# Patient Record
Sex: Female | Born: 1950
Health system: Southern US, Community
[De-identification: ages and names within clinical notes are randomized; demographics above are authoritative.]

## PROBLEM LIST (undated history)

## (undated) DIAGNOSIS — E119 Type 2 diabetes mellitus without complications: Secondary | ICD-10-CM

## (undated) DIAGNOSIS — F32A Depression, unspecified: Secondary | ICD-10-CM

## (undated) DIAGNOSIS — M1990 Unspecified osteoarthritis, unspecified site: Secondary | ICD-10-CM

## (undated) DIAGNOSIS — E7800 Pure hypercholesterolemia, unspecified: Secondary | ICD-10-CM

## (undated) DIAGNOSIS — K219 Gastro-esophageal reflux disease without esophagitis: Secondary | ICD-10-CM

## (undated) DIAGNOSIS — F329 Major depressive disorder, single episode, unspecified: Secondary | ICD-10-CM

## (undated) DIAGNOSIS — G4730 Sleep apnea, unspecified: Secondary | ICD-10-CM

## (undated) HISTORY — PX: BREAST BIOPSY: SHX20

## (undated) HISTORY — DX: Depression, unspecified: F32.A

## (undated) HISTORY — DX: Gastro-esophageal reflux disease without esophagitis: K21.9

## (undated) HISTORY — PX: TUBAL LIGATION: SHX77

## (undated) HISTORY — PX: HYSTERECTOMY ABDOMINAL WITH SALPINGECTOMY: SHX6725

## (undated) HISTORY — DX: Pure hypercholesterolemia, unspecified: E78.00

## (undated) HISTORY — DX: Type 2 diabetes mellitus without complications: E11.9

## (undated) HISTORY — DX: Unspecified osteoarthritis, unspecified site: M19.90

## (undated) HISTORY — PX: NASAL SEPTUM SURGERY: SHX37

## (undated) HISTORY — PX: KNEE SURGERY: SHX244

## (undated) HISTORY — DX: Major depressive disorder, single episode, unspecified: F32.9

---

## 2015-11-03 DIAGNOSIS — E559 Vitamin D deficiency, unspecified: Secondary | ICD-10-CM | POA: Diagnosis not present

## 2015-11-03 DIAGNOSIS — E785 Hyperlipidemia, unspecified: Secondary | ICD-10-CM | POA: Diagnosis not present

## 2015-11-03 DIAGNOSIS — E1165 Type 2 diabetes mellitus with hyperglycemia: Secondary | ICD-10-CM | POA: Diagnosis not present

## 2015-11-03 DIAGNOSIS — Z1159 Encounter for screening for other viral diseases: Secondary | ICD-10-CM | POA: Diagnosis not present

## 2015-11-04 DIAGNOSIS — E559 Vitamin D deficiency, unspecified: Secondary | ICD-10-CM | POA: Diagnosis not present

## 2015-11-04 DIAGNOSIS — Z1159 Encounter for screening for other viral diseases: Secondary | ICD-10-CM | POA: Diagnosis not present

## 2015-11-04 DIAGNOSIS — E785 Hyperlipidemia, unspecified: Secondary | ICD-10-CM | POA: Diagnosis not present

## 2015-11-04 DIAGNOSIS — E1165 Type 2 diabetes mellitus with hyperglycemia: Secondary | ICD-10-CM | POA: Diagnosis not present

## 2016-03-03 DIAGNOSIS — E119 Type 2 diabetes mellitus without complications: Secondary | ICD-10-CM | POA: Diagnosis not present

## 2016-03-03 DIAGNOSIS — F330 Major depressive disorder, recurrent, mild: Secondary | ICD-10-CM | POA: Diagnosis not present

## 2016-03-03 DIAGNOSIS — Z6836 Body mass index (BMI) 36.0-36.9, adult: Secondary | ICD-10-CM | POA: Diagnosis not present

## 2016-03-19 DIAGNOSIS — R7301 Impaired fasting glucose: Secondary | ICD-10-CM | POA: Diagnosis not present

## 2016-03-19 DIAGNOSIS — I10 Essential (primary) hypertension: Secondary | ICD-10-CM | POA: Diagnosis not present

## 2016-03-19 DIAGNOSIS — I482 Chronic atrial fibrillation: Secondary | ICD-10-CM | POA: Diagnosis not present

## 2016-03-19 DIAGNOSIS — E039 Hypothyroidism, unspecified: Secondary | ICD-10-CM | POA: Diagnosis not present

## 2016-03-19 DIAGNOSIS — E119 Type 2 diabetes mellitus without complications: Secondary | ICD-10-CM | POA: Diagnosis not present

## 2016-03-19 DIAGNOSIS — E785 Hyperlipidemia, unspecified: Secondary | ICD-10-CM | POA: Diagnosis not present

## 2016-03-19 DIAGNOSIS — R160 Hepatomegaly, not elsewhere classified: Secondary | ICD-10-CM | POA: Diagnosis not present

## 2016-03-19 DIAGNOSIS — E782 Mixed hyperlipidemia: Secondary | ICD-10-CM | POA: Diagnosis not present

## 2016-03-22 DIAGNOSIS — E782 Mixed hyperlipidemia: Secondary | ICD-10-CM | POA: Diagnosis not present

## 2016-03-22 DIAGNOSIS — Z6836 Body mass index (BMI) 36.0-36.9, adult: Secondary | ICD-10-CM | POA: Diagnosis not present

## 2016-03-22 DIAGNOSIS — Z0000 Encounter for general adult medical examination without abnormal findings: Secondary | ICD-10-CM | POA: Diagnosis not present

## 2016-03-22 DIAGNOSIS — E119 Type 2 diabetes mellitus without complications: Secondary | ICD-10-CM | POA: Diagnosis not present

## 2016-03-22 DIAGNOSIS — F330 Major depressive disorder, recurrent, mild: Secondary | ICD-10-CM | POA: Diagnosis not present

## 2016-03-22 DIAGNOSIS — R945 Abnormal results of liver function studies: Secondary | ICD-10-CM | POA: Diagnosis not present

## 2016-03-23 ENCOUNTER — Other Ambulatory Visit (HOSPITAL_COMMUNITY): Payer: Self-pay | Admitting: Internal Medicine

## 2016-03-23 DIAGNOSIS — E119 Type 2 diabetes mellitus without complications: Secondary | ICD-10-CM | POA: Diagnosis not present

## 2016-03-23 DIAGNOSIS — H2513 Age-related nuclear cataract, bilateral: Secondary | ICD-10-CM | POA: Diagnosis not present

## 2016-03-23 DIAGNOSIS — R160 Hepatomegaly, not elsewhere classified: Secondary | ICD-10-CM

## 2016-03-30 ENCOUNTER — Ambulatory Visit (HOSPITAL_COMMUNITY)
Admission: RE | Admit: 2016-03-30 | Discharge: 2016-03-30 | Disposition: A | Discharge status: Home / Self Care | Payer: Medicare Other | Source: Ambulatory Visit | Attending: Internal Medicine | Admitting: Internal Medicine

## 2016-03-30 DIAGNOSIS — K760 Fatty (change of) liver, not elsewhere classified: Secondary | ICD-10-CM | POA: Insufficient documentation

## 2016-03-30 DIAGNOSIS — R160 Hepatomegaly, not elsewhere classified: Secondary | ICD-10-CM | POA: Diagnosis not present

## 2016-04-28 DIAGNOSIS — F330 Major depressive disorder, recurrent, mild: Secondary | ICD-10-CM | POA: Diagnosis not present

## 2016-04-28 DIAGNOSIS — E782 Mixed hyperlipidemia: Secondary | ICD-10-CM | POA: Diagnosis not present

## 2016-04-28 DIAGNOSIS — E119 Type 2 diabetes mellitus without complications: Secondary | ICD-10-CM | POA: Diagnosis not present

## 2016-04-28 DIAGNOSIS — Z23 Encounter for immunization: Secondary | ICD-10-CM | POA: Diagnosis not present

## 2016-04-28 DIAGNOSIS — Z6837 Body mass index (BMI) 37.0-37.9, adult: Secondary | ICD-10-CM | POA: Diagnosis not present

## 2016-06-09 DIAGNOSIS — Z6837 Body mass index (BMI) 37.0-37.9, adult: Secondary | ICD-10-CM | POA: Diagnosis not present

## 2016-06-09 DIAGNOSIS — F330 Major depressive disorder, recurrent, mild: Secondary | ICD-10-CM | POA: Diagnosis not present

## 2016-06-09 DIAGNOSIS — E782 Mixed hyperlipidemia: Secondary | ICD-10-CM | POA: Diagnosis not present

## 2016-06-09 DIAGNOSIS — E119 Type 2 diabetes mellitus without complications: Secondary | ICD-10-CM | POA: Diagnosis not present

## 2016-07-12 DIAGNOSIS — E119 Type 2 diabetes mellitus without complications: Secondary | ICD-10-CM | POA: Diagnosis not present

## 2016-07-12 DIAGNOSIS — E782 Mixed hyperlipidemia: Secondary | ICD-10-CM | POA: Diagnosis not present

## 2016-07-14 DIAGNOSIS — F330 Major depressive disorder, recurrent, mild: Secondary | ICD-10-CM | POA: Diagnosis not present

## 2016-07-14 DIAGNOSIS — E782 Mixed hyperlipidemia: Secondary | ICD-10-CM | POA: Diagnosis not present

## 2016-07-14 DIAGNOSIS — E1165 Type 2 diabetes mellitus with hyperglycemia: Secondary | ICD-10-CM | POA: Diagnosis not present

## 2016-07-14 DIAGNOSIS — Z6838 Body mass index (BMI) 38.0-38.9, adult: Secondary | ICD-10-CM | POA: Diagnosis not present

## 2016-08-04 DIAGNOSIS — Z6838 Body mass index (BMI) 38.0-38.9, adult: Secondary | ICD-10-CM | POA: Diagnosis not present

## 2016-08-04 DIAGNOSIS — E1165 Type 2 diabetes mellitus with hyperglycemia: Secondary | ICD-10-CM | POA: Diagnosis not present

## 2016-08-04 DIAGNOSIS — F330 Major depressive disorder, recurrent, mild: Secondary | ICD-10-CM | POA: Diagnosis not present

## 2016-08-04 DIAGNOSIS — E782 Mixed hyperlipidemia: Secondary | ICD-10-CM | POA: Diagnosis not present

## 2016-08-12 DIAGNOSIS — F330 Major depressive disorder, recurrent, mild: Secondary | ICD-10-CM | POA: Diagnosis not present

## 2016-08-12 DIAGNOSIS — R4182 Altered mental status, unspecified: Secondary | ICD-10-CM | POA: Diagnosis not present

## 2016-08-12 DIAGNOSIS — Z6838 Body mass index (BMI) 38.0-38.9, adult: Secondary | ICD-10-CM | POA: Diagnosis not present

## 2016-08-18 ENCOUNTER — Other Ambulatory Visit (HOSPITAL_COMMUNITY): Payer: Self-pay | Admitting: Internal Medicine

## 2016-08-18 DIAGNOSIS — R4182 Altered mental status, unspecified: Secondary | ICD-10-CM

## 2016-08-25 ENCOUNTER — Ambulatory Visit (HOSPITAL_COMMUNITY)
Admission: RE | Admit: 2016-08-25 | Discharge: 2016-08-25 | Disposition: A | Discharge status: Home / Self Care | Payer: Medicare Other | Source: Ambulatory Visit | Attending: Internal Medicine | Admitting: Internal Medicine

## 2016-08-25 DIAGNOSIS — R410 Disorientation, unspecified: Secondary | ICD-10-CM | POA: Diagnosis not present

## 2016-08-25 DIAGNOSIS — R4182 Altered mental status, unspecified: Secondary | ICD-10-CM | POA: Insufficient documentation

## 2016-08-25 DIAGNOSIS — E041 Nontoxic single thyroid nodule: Secondary | ICD-10-CM | POA: Insufficient documentation

## 2016-09-24 ENCOUNTER — Other Ambulatory Visit (HOSPITAL_COMMUNITY): Payer: Self-pay | Admitting: Internal Medicine

## 2016-09-24 DIAGNOSIS — E041 Nontoxic single thyroid nodule: Secondary | ICD-10-CM

## 2016-09-28 ENCOUNTER — Ambulatory Visit (HOSPITAL_COMMUNITY)
Admission: RE | Admit: 2016-09-28 | Discharge: 2016-09-28 | Disposition: A | Discharge status: Home / Self Care | Payer: Medicare Other | Source: Ambulatory Visit | Attending: Internal Medicine | Admitting: Internal Medicine

## 2016-09-28 DIAGNOSIS — E042 Nontoxic multinodular goiter: Secondary | ICD-10-CM | POA: Diagnosis not present

## 2016-09-28 DIAGNOSIS — E041 Nontoxic single thyroid nodule: Secondary | ICD-10-CM

## 2016-11-05 DIAGNOSIS — M25512 Pain in left shoulder: Secondary | ICD-10-CM | POA: Diagnosis not present

## 2016-11-05 DIAGNOSIS — J019 Acute sinusitis, unspecified: Secondary | ICD-10-CM | POA: Diagnosis not present

## 2016-11-05 DIAGNOSIS — Z6838 Body mass index (BMI) 38.0-38.9, adult: Secondary | ICD-10-CM | POA: Diagnosis not present

## 2016-11-15 DIAGNOSIS — E1165 Type 2 diabetes mellitus with hyperglycemia: Secondary | ICD-10-CM | POA: Diagnosis not present

## 2016-11-15 DIAGNOSIS — E782 Mixed hyperlipidemia: Secondary | ICD-10-CM | POA: Diagnosis not present

## 2016-11-17 ENCOUNTER — Encounter: Payer: Self-pay | Admitting: Orthopedic Surgery

## 2016-11-17 ENCOUNTER — Ambulatory Visit (INDEPENDENT_AMBULATORY_CARE_PROVIDER_SITE_OTHER): Payer: Medicare Other | Admitting: Orthopedic Surgery

## 2016-11-17 ENCOUNTER — Ambulatory Visit (INDEPENDENT_AMBULATORY_CARE_PROVIDER_SITE_OTHER): Payer: Medicare Other

## 2016-11-17 VITALS — BP 132/81 | HR 85 | Ht 64.0 in | Wt 227.0 lb

## 2016-11-17 DIAGNOSIS — M7551 Bursitis of right shoulder: Secondary | ICD-10-CM

## 2016-11-17 DIAGNOSIS — M47812 Spondylosis without myelopathy or radiculopathy, cervical region: Secondary | ICD-10-CM

## 2016-11-17 DIAGNOSIS — M542 Cervicalgia: Secondary | ICD-10-CM

## 2016-11-17 DIAGNOSIS — M25511 Pain in right shoulder: Secondary | ICD-10-CM | POA: Diagnosis not present

## 2016-11-17 NOTE — Progress Notes (Addendum)
NEW PATIENT OFFICE VISIT    Chief Complaint  Patient presents with  . New Patient (Initial Visit)    Right Shoulder pain from fall 09/2016    New patient evaluation. 66 years old fell off a ladder first week of August comes in complaining of right shoulder and arm pain. She describes a dull ache in the right upper arm near the deltoid which is constant and associated with intermittent pain with internal rotation some loss of motion with internal rotation normal forward elevation with pain.  She also describes what she refers to as a tic. She says she often has to turn or bend her neck away from her right side because of pain she says it's spasmodic she's had that for a year and when she puts her arm on the steering well and drives for long time fingers go numb    Review of Systems  Constitutional: Negative for fever.  Respiratory: Negative.   Cardiovascular: Negative.   Musculoskeletal: Positive for neck pain.  Neurological: Positive for tingling and sensory change. Negative for speech change and seizures.     Past Medical History:  Diagnosis Date  . Depression   . Diabetes mellitus without complication (HCC)   . GERD (gastroesophageal reflux disease)   . Osteoarthritis       History reviewed. No pertinent family history. Social History  Substance Use Topics  . Smoking status: Not on file  . Smokeless tobacco: Not on file  . Alcohol use Not on file    BP 132/81   Pulse 85   Ht  (1.626 m)   Wt 227 lb (103 kg)   BMI 38.96 kg/m   Physical Exam  Constitutional: She is oriented to person, place, and time and well-developed, well-nourished, and in no distress. No distress.  Neck: Normal range of motion and full passive range of motion without pain. Neck supple. Spinous process tenderness and muscular tenderness present.  Cardiovascular: Intact distal pulses.   Neurological: She is alert and oriented to person, place, and time. She displays normal reflexes. She  exhibits normal muscle tone. Gait normal. Coordination normal.  Skin: She is not diaphoretic.  Psychiatric: Mood, memory, affect and judgment normal.     Physical Exam  Constitutional: She is oriented to person, place, and time and well-developed, well-nourished, and in no distress. No distress.  Neck: Normal range of motion and full passive range of motion without pain. Neck supple. Spinous process tenderness and muscular tenderness present.  Cardiovascular: Intact distal pulses.   Neurological: She is alert and oriented to person, place, and time. She displays normal reflexes. She exhibits normal muscle tone. Gait normal. Coordination normal.  Skin: She is not diaphoretic.  Psychiatric: Mood, memory, affect and judgment normal.    Left Shoulder Exam   Tenderness  None  Range of Motion  Normal left shoulder ROM  Muscle Strength  Normal left shoulder strength  Tests  Impingement:   Negative Drop Arm:        Negative Apprehension: Negative Sulcus:            Negative  Right Shoulder Exam   Tenderness  The patient is experiencing tenderness in the upper arm and deltoid .  Range of Motion  Active Abduction:                       Normal Passive Abduction:  Normal Extension:                                  Normal Forward Flexion:                        180 External Rotation:                      50 Internal Rotation 0 degrees:      Mid Thoracic  Muscle Strength  Abduction:            5/5 Internal Rotation:  5/5 External Rotation: 5/5 Supraspinatus:     5/5 Subscapularis:     5/5 Biceps:                 5/5    X-rays today read by me see report  Chronic bone changes of the greater tuberosity and undersurface of acromion show evidence of chronic rotator cuff disease without acute injury  Cervical spine films show moderate spondylosis C3-C5   Encounter Diagnoses  Name Primary?  . Cervical spondylosis without myelopathy Yes  . Subacromial  bursitis of right shoulder joint     PLAN:   #1 subacromial injection  Procedure note the subacromial injection shoulder RIGHT  Verbal consent was obtained to inject the  RIGHT   Shoulder  Timeout was completed to confirm the injection site is a subacromial space of the  RIGHT  shoulder   Medication used Depo-Medrol 40 mg and lidocaine 1% 3 cc  Anesthesia was provided by ethyl chloride  The injection was performed in the RIGHT  posterior subacromial space. After pinning the skin with alcohol and anesthetized the skin with ethyl chloride the subacromial space was injected using a 20-gauge needle. There were no complications  Sterile dressing was applied.   #2 physical therapy at hand in rehabilitation neck and shoulder  Follow up as needed

## 2016-11-17 NOTE — Patient Instructions (Addendum)
We have scheduled you for therapy on the neck and shoulder, if you do not improve after 6 weeks please schedule a new appointment    Bursitis Bursitis is inflammation and irritation of a bursa, which is one of the small, fluid-filled sacs that cushion and protect the moving parts of your body. These sacs are located between bones and muscles, muscle attachments, or skin areas next to bones. A bursa protects these structures from the wear and tear that results from frequent movement. An inflamed bursa causes pain and swelling. Fluid may build up inside the sac. Bursitis is most common near joints, especially the knees, elbows, hips, and shoulders. What are the causes? Bursitis can be caused by:  Injury from: ? A direct blow, like falling on your knee or elbow. ? Overuse of a joint (repetitive stress).  Infection. This can happen if bacteria gets into a bursa through a cut or scrape near a joint.  Diseases that cause joint inflammation, such as gout and rheumatoid arthritis.  What increases the risk? You may be at risk for bursitis if you:  Have a job or hobby that involves a lot of repetitive stress on your joints.  Have a condition that weakens your body's defense system (immune system), such as diabetes, cancer, or HIV.  Lift and reach overhead often.  Kneel or lean on hard surfaces often.  Run or walk often.  What are the signs or symptoms? The most common signs and symptoms of bursitis are:  Pain that gets worse when you move the affected body part or put weight on it.  Inflammation.  Stiffness.  Other signs and symptoms may include:  Redness.  Tenderness.  Warmth.  Pain that continues after rest.  Fever and chills. This may occur in bursitis caused by infection.  How is this diagnosed? Bursitis may be diagnosed by:  Medical history and physical exam.  MRI.  A procedure to drain fluid from the bursa with a needle (aspiration). The fluid may be checked for  signs of infection or gout.  Blood tests to rule out other causes of inflammation.  How is this treated? Bursitis can usually be treated at home with rest, ice, compression, and elevation (RICE). For mild bursitis, RICE treatment may be all you need. Other treatments may include:  Nonsteroidal anti-inflammatory drugs (NSAIDs) to treat pain and inflammation.  Corticosteroids to fight inflammation. You may have these drugs injected into and around the area of bursitis.  Aspiration of bursitis fluid to relieve pain and improve movement.  Antibiotic medicine to treat an infected bursa.  A splint, brace, or walking aid.  Physical therapy if you continue to have pain or limited movement.  Surgery to remove a damaged or infected bursa. This may be needed if you have a very bad case of bursitis or if other treatments have not worked.  Follow these instructions at home:  Take medicines only as directed by your health care provider.  If you were prescribed an antibiotic medicine, finish it all even if you start to feel better.  Rest the affected area as directed by your health care provider. ? Keep the area elevated. ? Avoid activities that make pain worse.  Apply ice to the injured area: ? Place ice in a plastic bag. ? Place a towel between your skin and the bag. ? Leave the ice on for 20 minutes, 2-3 times a day.  Use splints, braces, pads, or walking aids as directed by your health care provider.  Keep all follow-up visits as directed by your health care provider. This is important. How is this prevented?  Wear knee pads if you kneel often.  Wear sturdy running or walking shoes that fit you well.  Take regular breaks from repetitive activity.  Warm up by stretching before doing any strenuous activity.  Maintain a healthy weight or lose weight as recommended by your health care provider. Ask your health care provider if you need help.  Exercise regularly. Start any new  physical activity gradually. Contact a health care provider if:  Your bursitis is not responding to treatment or home care.  You have a fever.  You have chills. This information is not intended to replace advice given to you by your health care provider. Make sure you discuss any questions you have with your health care provider. Document Released: 01/23/2000 Document Revised: 07/03/2015 Document Reviewed: 04/16/2013 Elsevier Interactive Patient Education  2018 [employer].  Cervical Radiculopathy Cervical radiculopathy means that a nerve in the neck is pinched or bruised. This can cause pain or loss of feeling (numbness) that runs from your neck to your arm and fingers. Follow these instructions at home: Managing pain  Take over-the-counter and prescription medicines only as told by your doctor.  If directed, put ice on the injured or painful area. ? Put ice in a plastic bag. ? Place a towel between your skin and the bag. ? Leave the ice on for 20 minutes, 2-3 times per day.  If ice does not help, you can try using heat. Take a warm shower or warm bath, or use a heat pack as told by your doctor.  You may try a gentle neck and shoulder massage. Activity  Rest as needed. Follow instructions from your doctor about any activities to avoid.  Do exercises as told by your doctor or physical therapist. General instructions  If you were given a soft collar, wear it as told by your doctor.  Use a flat pillow when you sleep.  Keep all follow-up visits as told by your doctor. This is important. Contact a doctor if:  Your condition does not improve with treatment. Get help right away if:  Your pain gets worse and is not controlled with medicine.  You lose feeling or feel weak in your hand, arm, face, or leg.  You have a fever.  You have a stiff neck.  You cannot control when you poop or pee (have incontinence).  You have trouble with walking, balance, or talking. This  information is not intended to replace advice given to you by your health care provider. Make sure you discuss any questions you have with your health care provider. Document Released: 01/14/2011 Document Revised: 07/03/2015 Document Reviewed: 03/21/2014 Elsevier Interactive Patient Education  [employer].

## 2016-11-19 DIAGNOSIS — E1165 Type 2 diabetes mellitus with hyperglycemia: Secondary | ICD-10-CM | POA: Diagnosis not present

## 2016-11-19 DIAGNOSIS — E782 Mixed hyperlipidemia: Secondary | ICD-10-CM | POA: Diagnosis not present

## 2016-11-19 DIAGNOSIS — R1310 Dysphagia, unspecified: Secondary | ICD-10-CM | POA: Diagnosis not present

## 2016-11-19 DIAGNOSIS — F330 Major depressive disorder, recurrent, mild: Secondary | ICD-10-CM | POA: Diagnosis not present

## 2016-11-19 DIAGNOSIS — B379 Candidiasis, unspecified: Secondary | ICD-10-CM | POA: Diagnosis not present

## 2016-11-26 DIAGNOSIS — M545 Low back pain: Secondary | ICD-10-CM | POA: Diagnosis not present

## 2016-11-26 DIAGNOSIS — M2560 Stiffness of unspecified joint, not elsewhere classified: Secondary | ICD-10-CM | POA: Diagnosis not present

## 2016-11-26 DIAGNOSIS — M542 Cervicalgia: Secondary | ICD-10-CM | POA: Diagnosis not present

## 2016-11-26 DIAGNOSIS — M25511 Pain in right shoulder: Secondary | ICD-10-CM | POA: Diagnosis not present

## 2016-11-30 DIAGNOSIS — M545 Low back pain: Secondary | ICD-10-CM | POA: Diagnosis not present

## 2016-11-30 DIAGNOSIS — M542 Cervicalgia: Secondary | ICD-10-CM | POA: Diagnosis not present

## 2016-11-30 DIAGNOSIS — M2560 Stiffness of unspecified joint, not elsewhere classified: Secondary | ICD-10-CM | POA: Diagnosis not present

## 2016-11-30 DIAGNOSIS — M25511 Pain in right shoulder: Secondary | ICD-10-CM | POA: Diagnosis not present

## 2016-12-03 ENCOUNTER — Encounter: Payer: Self-pay | Admitting: Internal Medicine

## 2016-12-06 DIAGNOSIS — M545 Low back pain: Secondary | ICD-10-CM | POA: Diagnosis not present

## 2016-12-06 DIAGNOSIS — M25511 Pain in right shoulder: Secondary | ICD-10-CM | POA: Diagnosis not present

## 2016-12-06 DIAGNOSIS — M2560 Stiffness of unspecified joint, not elsewhere classified: Secondary | ICD-10-CM | POA: Diagnosis not present

## 2016-12-06 DIAGNOSIS — M542 Cervicalgia: Secondary | ICD-10-CM | POA: Diagnosis not present

## 2016-12-09 DIAGNOSIS — M25511 Pain in right shoulder: Secondary | ICD-10-CM | POA: Diagnosis not present

## 2016-12-09 DIAGNOSIS — M2560 Stiffness of unspecified joint, not elsewhere classified: Secondary | ICD-10-CM | POA: Diagnosis not present

## 2016-12-09 DIAGNOSIS — M542 Cervicalgia: Secondary | ICD-10-CM | POA: Diagnosis not present

## 2016-12-09 DIAGNOSIS — M545 Low back pain: Secondary | ICD-10-CM | POA: Diagnosis not present

## 2016-12-13 DIAGNOSIS — M542 Cervicalgia: Secondary | ICD-10-CM | POA: Diagnosis not present

## 2016-12-13 DIAGNOSIS — M25511 Pain in right shoulder: Secondary | ICD-10-CM | POA: Diagnosis not present

## 2016-12-13 DIAGNOSIS — M2560 Stiffness of unspecified joint, not elsewhere classified: Secondary | ICD-10-CM | POA: Diagnosis not present

## 2016-12-13 DIAGNOSIS — M545 Low back pain: Secondary | ICD-10-CM | POA: Diagnosis not present

## 2016-12-16 DIAGNOSIS — M542 Cervicalgia: Secondary | ICD-10-CM | POA: Diagnosis not present

## 2016-12-16 DIAGNOSIS — M25511 Pain in right shoulder: Secondary | ICD-10-CM | POA: Diagnosis not present

## 2016-12-16 DIAGNOSIS — M545 Low back pain: Secondary | ICD-10-CM | POA: Diagnosis not present

## 2016-12-16 DIAGNOSIS — M2560 Stiffness of unspecified joint, not elsewhere classified: Secondary | ICD-10-CM | POA: Diagnosis not present

## 2016-12-20 DIAGNOSIS — M545 Low back pain: Secondary | ICD-10-CM | POA: Diagnosis not present

## 2016-12-20 DIAGNOSIS — M25511 Pain in right shoulder: Secondary | ICD-10-CM | POA: Diagnosis not present

## 2016-12-20 DIAGNOSIS — M2560 Stiffness of unspecified joint, not elsewhere classified: Secondary | ICD-10-CM | POA: Diagnosis not present

## 2016-12-20 DIAGNOSIS — M542 Cervicalgia: Secondary | ICD-10-CM | POA: Diagnosis not present

## 2016-12-28 DIAGNOSIS — M2560 Stiffness of unspecified joint, not elsewhere classified: Secondary | ICD-10-CM | POA: Diagnosis not present

## 2016-12-28 DIAGNOSIS — M542 Cervicalgia: Secondary | ICD-10-CM | POA: Diagnosis not present

## 2016-12-28 DIAGNOSIS — M25511 Pain in right shoulder: Secondary | ICD-10-CM | POA: Diagnosis not present

## 2016-12-28 DIAGNOSIS — M545 Low back pain: Secondary | ICD-10-CM | POA: Diagnosis not present

## 2017-01-03 DIAGNOSIS — M545 Low back pain: Secondary | ICD-10-CM | POA: Diagnosis not present

## 2017-01-03 DIAGNOSIS — M542 Cervicalgia: Secondary | ICD-10-CM | POA: Diagnosis not present

## 2017-01-03 DIAGNOSIS — M25511 Pain in right shoulder: Secondary | ICD-10-CM | POA: Diagnosis not present

## 2017-01-03 DIAGNOSIS — M2560 Stiffness of unspecified joint, not elsewhere classified: Secondary | ICD-10-CM | POA: Diagnosis not present

## 2017-01-07 DIAGNOSIS — M545 Low back pain: Secondary | ICD-10-CM | POA: Diagnosis not present

## 2017-01-07 DIAGNOSIS — M25511 Pain in right shoulder: Secondary | ICD-10-CM | POA: Diagnosis not present

## 2017-01-07 DIAGNOSIS — M542 Cervicalgia: Secondary | ICD-10-CM | POA: Diagnosis not present

## 2017-01-07 DIAGNOSIS — M2560 Stiffness of unspecified joint, not elsewhere classified: Secondary | ICD-10-CM | POA: Diagnosis not present

## 2017-01-10 DIAGNOSIS — M545 Low back pain: Secondary | ICD-10-CM | POA: Diagnosis not present

## 2017-01-10 DIAGNOSIS — M25511 Pain in right shoulder: Secondary | ICD-10-CM | POA: Diagnosis not present

## 2017-01-10 DIAGNOSIS — M2560 Stiffness of unspecified joint, not elsewhere classified: Secondary | ICD-10-CM | POA: Diagnosis not present

## 2017-01-10 DIAGNOSIS — M542 Cervicalgia: Secondary | ICD-10-CM | POA: Diagnosis not present

## 2017-01-14 DIAGNOSIS — E1165 Type 2 diabetes mellitus with hyperglycemia: Secondary | ICD-10-CM | POA: Diagnosis not present

## 2017-01-14 DIAGNOSIS — F330 Major depressive disorder, recurrent, mild: Secondary | ICD-10-CM | POA: Diagnosis not present

## 2017-01-14 DIAGNOSIS — Z6835 Body mass index (BMI) 35.0-35.9, adult: Secondary | ICD-10-CM | POA: Diagnosis not present

## 2017-01-24 DIAGNOSIS — M25511 Pain in right shoulder: Secondary | ICD-10-CM | POA: Diagnosis not present

## 2017-01-24 DIAGNOSIS — M2560 Stiffness of unspecified joint, not elsewhere classified: Secondary | ICD-10-CM | POA: Diagnosis not present

## 2017-01-24 DIAGNOSIS — M542 Cervicalgia: Secondary | ICD-10-CM | POA: Diagnosis not present

## 2017-01-24 DIAGNOSIS — M545 Low back pain: Secondary | ICD-10-CM | POA: Diagnosis not present

## 2017-01-26 ENCOUNTER — Ambulatory Visit (INDEPENDENT_AMBULATORY_CARE_PROVIDER_SITE_OTHER): Payer: Medicare Other | Admitting: Gastroenterology

## 2017-01-26 ENCOUNTER — Telehealth: Payer: Self-pay | Admitting: *Deleted

## 2017-01-26 ENCOUNTER — Encounter: Payer: Self-pay | Admitting: Gastroenterology

## 2017-01-26 ENCOUNTER — Other Ambulatory Visit: Payer: Self-pay | Admitting: *Deleted

## 2017-01-26 ENCOUNTER — Encounter: Payer: Self-pay | Admitting: *Deleted

## 2017-01-26 VITALS — BP 139/75 | HR 79 | Temp 97.0°F | Ht 63.0 in | Wt 229.2 lb

## 2017-01-26 DIAGNOSIS — R1319 Other dysphagia: Secondary | ICD-10-CM | POA: Diagnosis not present

## 2017-01-26 DIAGNOSIS — K760 Fatty (change of) liver, not elsewhere classified: Secondary | ICD-10-CM | POA: Diagnosis not present

## 2017-01-26 NOTE — Telephone Encounter (Signed)
Called spoke with pt and is aware pre-op scheduled for 03/04/17 at 11:00am. Letter mailed to pt.

## 2017-01-26 NOTE — Patient Instructions (Signed)
We have scheduled you for an upper endoscopy with dilation by Dr. Jena Gaussourk.  We will decide about reflux medication after the procedure.  We will see you back in February 2019 to see how you are doing!

## 2017-01-26 NOTE — Assessment & Plan Note (Signed)
66 year old female with predominantly oropharyngeal dysphagia for several years, also noting a chronic globus sensation in setting of long-standing intermittent GERD. Currently not on a PPI and takes Tums every few weeks. [place]. She has not had a BPE, but I do note a thyroid ultrasound with bilateral thyroid nodules and cysts, with a complex nodule in left inferior thyroid that is followed by PCP. Discussed endoscopic evaluation with possible need for BPE thereafter.   Proceed with upper endoscopy possible dilation in the near future with Dr. Jena Gaussourk. The risks, benefits, and alternatives have been discussed in detail with patient. They have stated understanding and desire to proceed.  PROPOFOL due to polypharmacy Not currently on a PPI, which we had discussed empirically treating; however, finances are limited. Omeprazole would be best covered, but she is on Celexa 40 mg. Combination of these two could possibly affect QT interval. She is willing to consider other agents even though more costly but would like to wait until review of endoscopic findings. Would recommend Protonix (Tier 2), which would be more affordable.  Feb 2018 follow-up  Declining colonoscopy at this time. Last in 2006. Will discuss at next visit. No concerning lower GI signs/symptoms.

## 2017-01-26 NOTE — Assessment & Plan Note (Signed)
On ultrasound. LFTs normal. Continue following of LFTs, dietary and behavior modification.

## 2017-01-26 NOTE — Progress Notes (Signed)
Primary Care Physician:  Celene Squibb, MD Primary Gastroenterologist:  Dr. Gala Romney   Chief Complaint  Patient presents with  . Dysphagia    x few years.    HPI:   Maria Williamson is a 66 y.o. female presenting today at the request of Celene Squibb, MD secondary to dysphagia. She notes several year history of these symptoms.    Notes that at times she will be turning her head while drinking and choke. Always feels like she has a lump in her throat. Notes choking on liquids. Notes issues with solid foods as well. Feels like it is hitting something in her throat and choking her. Feels like she is being strangled. Predominantly oropharyngeal symptoms. Takes Tums for chronic reflux. No PPI. Her symptoms run in spells. For a few weeks will chew about 4 a day then be fine for a few weeks. Used to buy Nexium OTC but finances are limited. Omeprazole tier 2 on formulary and able to get from [facility] for 24$ 3 month supply. However, she is on Celexa 40 mg, so Prilosec would not be the best option to use. Protonix is Tier 2.   Notes a history of IBS. Used to be under a lot of stress and had to carry wipes and underwear. Was caring for an ailing parent. Much improved now. Every once in awhile will have an urgent BM that is fluffy and mucus but otherwise at baseline. Ranges between constipation and diarrhea. Miralax prn for constipation. Comes out as water if taking Miralax. Doesn't help move her bowels. Colonoscopy in 2006 in [place], reportedly normal. EGD also in 2006.  No rectal bleeding. A lot of nausea but attributes this to her medications. Does not want to pursue routine screening colonoscopy at this time.    Past Medical History:  Diagnosis Date  . Depression   . Diabetes mellitus without complication ([place])   . GERD (gastroesophageal reflux disease)   . Hypercholesterolemia   . Osteoarthritis     Past Surgical History:  Procedure Laterality Date  . HYSTERECTOMY ABDOMINAL WITH SALPINGECTOMY      . KNEE SURGERY    . NASAL SEPTUM SURGERY    . TUBAL LIGATION      Current Outpatient Medications  Medication Sig Dispense Refill  . aspirin 81 MG chewable tablet Chew 81 mg by mouth daily.     . citalopram (CELEXA) 40 MG tablet Take 40 mg by mouth daily.    . fenofibrate 160 MG tablet 160 mg daily.    . furosemide (LASIX) 20 MG tablet Take 10 mg by mouth.     Marland Kitchen glipiZIDE (GLUCOTROL) 10 MG tablet Take 10 mg by mouth 2 (two) times daily before a meal.    . Insulin Glargine (BASAGLAR KWIKPEN [place]) Inject 50 Units into the skin at bedtime.    Marland Kitchen LORazepam (ATIVAN) 1 MG tablet Take 1 mg by mouth 2 (two) times daily.     . meloxicam (MOBIC) 15 MG tablet Take 15 mg by mouth daily.     . metFORMIN (GLUCOPHAGE) 1000 MG tablet 1,000 mg 2 (two) times daily.     No current facility-administered medications for this visit.     Allergies as of 01/26/2017 - Review Complete 01/26/2017  Allergen Reaction Noted  . Excedrin extra strength [asa-apap-caff buffered] Diarrhea and Nausea Only 11/17/2016  . Statins Other (See Comments) 11/17/2016  . Wellbutrin [bupropion] Hives 11/17/2016    Family History  Problem Relation Age of Onset  .  Heart failure Mother   . Heart disease Father   . Cancer Father        Lung  . Multiple sclerosis Daughter   . Diabetes Paternal Aunt   . Heart disease Maternal Grandmother   . Heart disease Maternal Grandfather   . Diabetes Paternal Grandmother   . Heart disease Paternal Grandmother   . Heart disease Paternal Grandfather   . Colon cancer Neg Hx   . Colon polyps Neg Hx     Social History   Socioeconomic History  . Marital status: Divorced    Spouse name: Not on file  . Number of children: Not on file  . Years of education: Not on file  . Highest education level: Not on file  Social Needs  . Financial resource strain: Not on file  . Food insecurity - worry: Not on file  . Food insecurity - inability: Not on file  . Transportation needs - medical: Not  on file  . Transportation needs - non-medical: Not on file  Occupational History  . Occupation: retired    Comment: [employer], worked in a lab   Tobacco Use  . Smoking status: Former Smoker    Types: Cigarettes    Last attempt to quit: 12/09/2004    Years since quitting: 12.1  . Smokeless tobacco: Never Used  Substance and Sexual Activity  . Alcohol use: No    Frequency: Never  . Drug use: No  . Sexual activity: Not on file  Other Topics Concern  . Not on file  Social History Narrative  . Not on file    Review of Systems: Gen: Denies any fever, chills, fatigue, weight loss, lack of appetite.  CV: Denies chest pain, heart palpitations, peripheral edema, syncope.  Resp: Denies shortness of breath at rest or with exertion. Denies wheezing or cough.  GI: see HPI  GU : Denies urinary burning, urinary frequency, urinary hesitancy MS: Denies joint pain, muscle weakness, cramps, or limitation of movement.  Derm: Denies rash, itching, dry skin Psych: Denies depression, anxiety, memory loss, and confusion Heme: Denies bruising, bleeding, and enlarged lymph nodes.  Physical Exam: BP 139/75   Pulse 79   Temp (!) 97 F (36.1 C) (Oral)   Ht _0  (1.6 m)   Wt 229 lb 3.2 oz (104 kg)   BMI 40.60 kg/m  General:   Alert and oriented. Pleasant and cooperative. Well-nourished and well-developed.  Head:  Normocephalic and atraumatic. Eyes:  Without icterus, sclera clear and conjunctiva pink.  Ears:  Normal auditory acuity. Nose:  No deformity, discharge,  or lesions. Mouth:  No deformity or lesions, oral mucosa pink.  Lungs:  Clear to auscultation bilaterally. No wheezes, rales, or rhonchi. No distress.  Heart:  S1, S2 present without murmurs appreciated.  Abdomen:  +BS, soft, non-tender and non-distended. No HSM noted. No guarding or rebound. No masses appreciated.  Rectal:  Deferred  Msk:  Symmetrical without gross deformities. Normal posture. Extremities:  Without   edema. Neurologic:  Alert and  oriented x4 Skin:  Intact without significant lesions or rashes. Psych:  Alert and cooperative. Normal mood and affect.  Outside labs from Oct 2018: Hgb 14.1, Hct 41.5, Platelets 273, BUN 15, Creatinine 0.58, Tbili 0.3, Alk Phos 84, AST 28, ALT 36, A1c 8.8.   US thyroid Aug 2018: bilateral thyroid nodules and cysts, complex cystic nodule in inferior left thyroid with need for 1 year surveillance.

## 2017-01-26 NOTE — Progress Notes (Signed)
CC'ED TO PCP 

## 2017-03-03 NOTE — Patient Instructions (Signed)
Maria Williamson  03/03/2017     @PREFPERIOPPHARMACY @   Your procedure is scheduled on 03/10/2017.  Report to [name] at 6:30 A.M.  Call this number if you have problems the morning of surgery:  [phone]   Remember:  Do not eat food or drink liquids after midnight.  Take these medicines the morning of surgery with A SIP OF WATER Celexa, Ativan  GLUCOTROL - NONE MORNING OF PROCEDURE  GLUCOPHAGE - NONE MORNING OF PROCEDURE  BASAGLAR INSULIN - TAKE 100% OF MORNING DOSE ON 1/30                                                    TAKE 50% OF DINNER/BEDTIME DOSE 1/30                                                    TAKE 50 % OF MORNING DOSE AM OF PROCEDURE   Do not wear jewelry, make-up or nail polish.  Do not wear lotions, powders, or perfumes, or deodorant.  Do not shave 48 hours prior to surgery.  Men may shave face and neck.  Do not bring valuables to the hospital.  [facility] is not responsible for any belongings or valuables.  Contacts, dentures or bridgework may not be worn into surgery.  Leave your suitcase in the car.  After surgery it may be brought to your room.  For patients admitted to the hospital, discharge time will be determined by your treatment team.  Patients discharged the day of surgery will not be allowed to drive home.   Please read over the following fact sheets that you were given. Anesthesia Post-op Instructions      PATIENT INSTRUCTIONS POST-ANESTHESIA  IMMEDIATELY FOLLOWING SURGERY:  Do not drive or operate machinery for the first twenty four hours after surgery.  Do not make any important decisions for twenty four hours after surgery or while taking narcotic pain medications or sedatives.  If you develop intractable nausea and vomiting or a severe headache please notify your doctor immediately.  FOLLOW-UP:  Please make an appointment with your surgeon as instructed. You do not need to follow up with anesthesia unless specifically instructed  to do so.  WOUND CARE INSTRUCTIONS (if applicable):  Keep a dry clean dressing on the anesthesia/puncture wound site if there is drainage.  Once the wound has quit draining you may leave it open to air.  Generally you should leave the bandage intact for twenty four hours unless there is drainage.  If the epidural site drains for more than 36-48 hours please call the anesthesia department.  QUESTIONS?:  Please feel free to call your physician or the hospital operator if you have any questions, and they will be happy to assist you.      Esophagogastroduodenoscopy Esophagogastroduodenoscopy (EGD) is a procedure to examine the lining of the esophagus, stomach, and first part of the small intestine (duodenum). This procedure is done to check for problems such as inflammation, bleeding, ulcers, or growths. During this procedure, a long, flexible, lighted tube with a camera attached (endoscope) is inserted down the throat. Tell a health care provider about:  Any allergies you have.  All medicines you are taking, including vitamins, herbs, eye drops, creams, and over-the-counter medicines.  Any problems you or family members have had with anesthetic medicines.  Any blood disorders you have.  Any surgeries you have had.  Any medical conditions you have.  Whether you are pregnant or may be pregnant. What are the risks? Generally, this is a safe procedure. However, problems may occur, including:  Infection.  Bleeding.  A tear (perforation) in the esophagus, stomach, or duodenum.  Trouble breathing.  Excessive sweating.  Spasms of the larynx.  A slowed heartbeat.  Low blood pressure.  What happens before the procedure?  Follow instructions from your health care provider about eating or drinking restrictions.  Ask your health care provider about: ? Changing or stopping your regular medicines. This is especially important if you are taking diabetes medicines or blood  thinners. ? Taking medicines such as aspirin and ibuprofen. These medicines can thin your blood. Do not take these medicines before your procedure if your health care provider instructs you not to.  Plan to have someone take you home after the procedure.  If you wear dentures, be ready to remove them before the procedure. What happens during the procedure?  To reduce your risk of infection, your health care team will wash or sanitize their hands.  An IV tube will be put in a vein in your hand or arm. You will get medicines and fluids through this tube.  You will be given one or more of the following: ? A medicine to help you relax (sedative). ? A medicine to numb the area (local anesthetic). This medicine may be sprayed into your throat. It will make you feel more comfortable and keep you from gagging or coughing during the procedure. ? A medicine for pain.  A mouth guard may be placed in your mouth to protect your teeth and to keep you from biting on the endoscope.  You will be asked to lie on your left side.  The endoscope will be lowered down your throat into your esophagus, stomach, and duodenum.  Air will be put into the endoscope. This will help your health care provider see better.  The lining of your esophagus, stomach, and duodenum will be examined.  Your health care provider may: ? Take a tissue sample so it can be looked at in a lab (biopsy). ? Remove growths. ? Remove objects (foreign bodies) that are stuck. ? Treat any bleeding with medicines or other devices that stop tissue from bleeding. ? Widen (dilate) or stretch narrowed areas of your esophagus and stomach.  The endoscope will be taken out. The procedure may vary among health care providers and hospitals. What happens after the procedure?  Your blood pressure, heart rate, breathing rate, and blood oxygen level will be monitored often until the medicines you were given have worn off.  Do not eat or drink  anything until the numbing medicine has worn off and your gag reflex has returned. This information is not intended to replace advice given to you by your health care provider. Make sure you discuss any questions you have with your health care provider. Document Released: 05/28/2004 Document Revised: 07/03/2015 Document Reviewed: 12/19/2014 Elsevier Interactive Patient Education  2018 [employer]. Esophageal Dilatation Esophageal dilatation is a procedure to open a blocked or narrowed part of the esophagus. The esophagus is the long tube in your throat that carries food and liquid from your mouth to your stomach. The procedure is also called esophageal  dilation. You may need this procedure if you have a buildup of scar tissue in your esophagus that makes it difficult, painful, or even impossible to swallow. This can be caused by gastroesophageal reflux disease (GERD). In rare cases, people need this procedure because they have cancer of the esophagus or a problem with the way food moves through the esophagus. Sometimes you may need to have another dilatation to enlarge the opening of the esophagus gradually. Tell a health care provider about:  Any allergies you have.  All medicines you are taking, including vitamins, herbs, eye drops, creams, and over-the-counter medicines.  Any problems you or family members have had with anesthetic medicines.  Any blood disorders you have.  Any surgeries you have had.  Any medical conditions you have.  Any antibiotic medicines you are required to take before dental procedures. What are the risks? Generally, this is a safe procedure. However, problems can occur and include:  Bleeding from a tear in the lining of the esophagus.  A hole (perforation) in the esophagus.  What happens before the procedure?  Do not eat or drink anything after midnight on the night before the procedure or as directed by your health care provider.  Ask your health care  provider about changing or stopping your regular medicines. This is especially important if you are taking diabetes medicines or blood thinners.  Plan to have someone take you home after the procedure. What happens during the procedure?  You will be given a medicine that makes you relaxed and sleepy (sedative).  A medicine may be sprayed or gargled to numb the back of the throat.  Your health care provider can use various instruments to do an esophageal dilatation. During the procedure, the instrument used will be placed in your mouth and passed down into your esophagus. Options include: ? Simple dilators. This instrument is carefully placed in the esophagus to stretch it. ? Guided wire bougies. In this method, a flexible tube (endoscope) is used to insert a wire into the esophagus. The dilator is passed over this wire to enlarge the esophagus. Then the wire is removed. ? Balloon dilators. An endoscope with a small balloon at the end is passed down into the esophagus. Inflating the balloon gently stretches the esophagus and opens it up. What happens after the procedure?  Your blood pressure, heart rate, breathing rate, and blood oxygen level will be monitored often until the medicines you were given have worn off.  Your throat may feel slightly sore and will probably still feel numb. This will improve slowly over time.  You will not be allowed to eat or drink until the throat numbness has resolved.  If this is a same-day procedure, you may be allowed to go home once you have been able to drink, urinate, and sit on the edge of the bed without nausea or dizziness.  If this is a same-day procedure, you should have a friend or family member with you for the next 24 hours after the procedure. This information is not intended to replace advice given to you by your health care provider. Make sure you discuss any questions you have with your health care provider. Document Released: 03/18/2005  Document Revised: 07/03/2015 Document Reviewed: 06/06/2013 Elsevier Interactive Patient Education  [employer]2018 [employer] Inc[employer]

## 2017-03-04 ENCOUNTER — Encounter (HOSPITAL_COMMUNITY): Payer: Self-pay

## 2017-03-04 ENCOUNTER — Encounter (HOSPITAL_COMMUNITY)
Admission: RE | Admit: 2017-03-04 | Discharge: 2017-03-04 | Disposition: A | Discharge status: Home / Self Care | Payer: Medicare Other | Source: Ambulatory Visit | Attending: Internal Medicine | Admitting: Internal Medicine

## 2017-03-04 ENCOUNTER — Other Ambulatory Visit: Payer: Self-pay

## 2017-03-04 DIAGNOSIS — Z01812 Encounter for preprocedural laboratory examination: Secondary | ICD-10-CM | POA: Diagnosis not present

## 2017-03-04 DIAGNOSIS — Z01810 Encounter for preprocedural cardiovascular examination: Secondary | ICD-10-CM | POA: Diagnosis not present

## 2017-03-04 HISTORY — DX: Sleep apnea, unspecified: G47.30

## 2017-03-04 LAB — BASIC METABOLIC PANEL
Anion gap: 13 (ref 5–15)
BUN: 13 mg/dL (ref 6–20)
CALCIUM: 9.2 mg/dL (ref 8.9–10.3)
CO2: 21 mmol/L — AB (ref 22–32)
CREATININE: 0.65 mg/dL (ref 0.44–1.00)
Chloride: 98 mmol/L — ABNORMAL LOW (ref 101–111)
GFR calc Af Amer: >60 mL/min (ref >60)
GLUCOSE: 323 mg/dL — AB (ref 65–99)
Potassium: 4.3 mmol/L (ref 3.5–5.1)
Sodium: 132 mmol/L — ABNORMAL LOW (ref 135–145)

## 2017-03-04 LAB — CBC
HEMATOCRIT: 44.3 % (ref 36.0–46.0)
Hemoglobin: 14 g/dL (ref 12.0–15.0)
MCH: 27.4 pg (ref 26.0–34.0)
MCHC: 31.6 g/dL (ref 30.0–36.0)
MCV: 86.7 fL (ref 78.0–100.0)
PLATELETS: 291 10*3/uL (ref 150–400)
RBC: 5.11 MIL/uL (ref 3.87–5.11)
RDW: 13.4 % (ref 11.5–15.5)
WBC: 8.7 10*3/uL (ref 4.0–10.5)

## 2017-03-10 ENCOUNTER — Encounter (HOSPITAL_COMMUNITY)
Admission: RE | Disposition: A | Discharge status: Home / Self Care | Payer: Self-pay | Source: Ambulatory Visit | Attending: Internal Medicine

## 2017-03-10 ENCOUNTER — Encounter (HOSPITAL_COMMUNITY): Payer: Self-pay

## 2017-03-10 ENCOUNTER — Other Ambulatory Visit: Payer: Self-pay

## 2017-03-10 ENCOUNTER — Ambulatory Visit (HOSPITAL_COMMUNITY)
Admission: RE | Admit: 2017-03-10 | Discharge: 2017-03-10 | Disposition: A | Discharge status: Home / Self Care | Payer: Medicare Other | Source: Ambulatory Visit | Attending: Internal Medicine | Admitting: Internal Medicine

## 2017-03-10 ENCOUNTER — Ambulatory Visit (HOSPITAL_COMMUNITY): Payer: Medicare Other | Admitting: Anesthesiology

## 2017-03-10 DIAGNOSIS — K3189 Other diseases of stomach and duodenum: Secondary | ICD-10-CM | POA: Insufficient documentation

## 2017-03-10 DIAGNOSIS — Z794 Long term (current) use of insulin: Secondary | ICD-10-CM | POA: Diagnosis not present

## 2017-03-10 DIAGNOSIS — E7800 Pure hypercholesterolemia, unspecified: Secondary | ICD-10-CM | POA: Diagnosis not present

## 2017-03-10 DIAGNOSIS — K222 Esophageal obstruction: Secondary | ICD-10-CM | POA: Insufficient documentation

## 2017-03-10 DIAGNOSIS — R1310 Dysphagia, unspecified: Secondary | ICD-10-CM | POA: Diagnosis not present

## 2017-03-10 DIAGNOSIS — Z87891 Personal history of nicotine dependence: Secondary | ICD-10-CM | POA: Insufficient documentation

## 2017-03-10 DIAGNOSIS — G4730 Sleep apnea, unspecified: Secondary | ICD-10-CM | POA: Diagnosis not present

## 2017-03-10 DIAGNOSIS — K449 Diaphragmatic hernia without obstruction or gangrene: Secondary | ICD-10-CM | POA: Diagnosis not present

## 2017-03-10 DIAGNOSIS — E119 Type 2 diabetes mellitus without complications: Secondary | ICD-10-CM | POA: Insufficient documentation

## 2017-03-10 DIAGNOSIS — K319 Disease of stomach and duodenum, unspecified: Secondary | ICD-10-CM | POA: Insufficient documentation

## 2017-03-10 DIAGNOSIS — Z7982 Long term (current) use of aspirin: Secondary | ICD-10-CM | POA: Diagnosis not present

## 2017-03-10 DIAGNOSIS — Z888 Allergy status to other drugs, medicaments and biological substances status: Secondary | ICD-10-CM | POA: Insufficient documentation

## 2017-03-10 DIAGNOSIS — F329 Major depressive disorder, single episode, unspecified: Secondary | ICD-10-CM | POA: Insufficient documentation

## 2017-03-10 DIAGNOSIS — K210 Gastro-esophageal reflux disease with esophagitis: Secondary | ICD-10-CM | POA: Diagnosis not present

## 2017-03-10 DIAGNOSIS — K209 Esophagitis, unspecified: Secondary | ICD-10-CM | POA: Diagnosis not present

## 2017-03-10 DIAGNOSIS — R1319 Other dysphagia: Secondary | ICD-10-CM

## 2017-03-10 DIAGNOSIS — Z79899 Other long term (current) drug therapy: Secondary | ICD-10-CM | POA: Diagnosis not present

## 2017-03-10 DIAGNOSIS — K2950 Unspecified chronic gastritis without bleeding: Secondary | ICD-10-CM | POA: Diagnosis not present

## 2017-03-10 HISTORY — PX: ESOPHAGOGASTRODUODENOSCOPY (EGD) WITH PROPOFOL: SHX5813

## 2017-03-10 HISTORY — PX: BIOPSY: SHX5522

## 2017-03-10 HISTORY — PX: MALONEY DILATION: SHX5535

## 2017-03-10 LAB — GLUCOSE, CAPILLARY
Glucose-Capillary: 227 mg/dL — ABNORMAL HIGH (ref 65–99)
Glucose-Capillary: 231 mg/dL — ABNORMAL HIGH (ref 65–99)

## 2017-03-10 SURGERY — ESOPHAGOGASTRODUODENOSCOPY (EGD) WITH PROPOFOL
Anesthesia: Monitor Anesthesia Care

## 2017-03-10 MED ORDER — CHLORHEXIDINE GLUCONATE CLOTH 2 % EX PADS: 6.0000 | MEDICATED_PAD | Freq: Once | CUTANEOUS | Status: DC

## 2017-03-10 MED ORDER — LACTATED RINGERS IV SOLN
INTRAVENOUS | Status: DC
  Administered 2017-03-10: 07:00:00 via INTRAVENOUS

## 2017-03-10 MED ORDER — LIDOCAINE VISCOUS 2 % MT SOLN
15.0000 mL | Freq: Once | OROMUCOSAL | Status: AC
  Administered 2017-03-10: 15 mL via OROMUCOSAL

## 2017-03-10 MED ORDER — FENTANYL CITRATE (PF) 100 MCG/2ML IJ SOLN
25.0000 ug | Freq: Once | INTRAMUSCULAR | Status: AC
  Administered 2017-03-10: 25 ug via INTRAVENOUS

## 2017-03-10 MED ORDER — MIDAZOLAM HCL 2 MG/2ML IJ SOLN
INTRAMUSCULAR | Status: AC
  Filled 2017-03-10: qty 2

## 2017-03-10 MED ORDER — MIDAZOLAM HCL 5 MG/5ML IJ SOLN
INTRAMUSCULAR | Status: DC | PRN
  Administered 2017-03-10: 2 mg via INTRAVENOUS

## 2017-03-10 MED ORDER — FENTANYL CITRATE (PF) 100 MCG/2ML IJ SOLN
INTRAMUSCULAR | Status: AC
  Filled 2017-03-10: qty 2

## 2017-03-10 MED ORDER — PROPOFOL 10 MG/ML IV BOLUS
INTRAVENOUS | Status: AC
  Filled 2017-03-10: qty 40

## 2017-03-10 MED ORDER — LIDOCAINE VISCOUS 2 % MT SOLN
OROMUCOSAL | Status: AC
  Filled 2017-03-10: qty 15

## 2017-03-10 MED ORDER — MIDAZOLAM HCL 2 MG/2ML IJ SOLN
INTRAMUSCULAR | Status: AC
Start: 2017-03-10 — End: 2017-03-10
  Filled 2017-03-10: qty 2

## 2017-03-10 MED ORDER — PROPOFOL 500 MG/50ML IV EMUL
INTRAVENOUS | Status: DC | PRN
  Administered 2017-03-10: 125 ug/kg/min via INTRAVENOUS

## 2017-03-10 MED ORDER — MIDAZOLAM HCL 2 MG/2ML IJ SOLN
1.0000 mg | INTRAMUSCULAR | Status: AC
  Administered 2017-03-10: 2 mg via INTRAVENOUS

## 2017-03-10 NOTE — H&P (Signed)
 @LOGO @   Primary Care Physician:  Benita StabileHall, John Z, MD Primary Gastroenterologist:  Dr. Jena Gaussourk  Pre-Procedure History & Physical: HPI:  Maria Williamson is a 67 y.o. female here for further evaluation of the oropharyngeal and esophageal dysphagia, the EGD.  Past Medical History:  Diagnosis Date  . Depression   . Diabetes mellitus without complication (HCC)   . GERD (gastroesophageal reflux disease)   . Hypercholesterolemia   . Osteoarthritis   . Sleep apnea    cannot tolerate CPAP    Past Surgical History:  Procedure Laterality Date  . HYSTERECTOMY ABDOMINAL WITH SALPINGECTOMY    . KNEE SURGERY Left   . NASAL SEPTUM SURGERY    . TUBAL LIGATION      Prior to Admission medications   Medication Sig Start Date End Date Taking? Authorizing Provider  aspirin EC 81 MG tablet Take 81 mg by mouth daily.   Yes [provider]  citalopram (CELEXA) 40 MG tablet Take 40 mg by mouth at bedtime.    Yes [provider]  diphenhydrAMINE (BENADRYL) 25 MG tablet Take 12.5 mg by mouth 2 (two) times daily as needed for allergies.   Yes [provider]  fenofibrate 160 MG tablet Take 160 mg by mouth daily.  11/11/16  Yes [provider]  furosemide (LASIX) 20 MG tablet Take 10 mg by mouth daily.    Yes [provider]  glipiZIDE (GLUCOTROL) 10 MG tablet Take 10 mg by mouth 2 (two) times daily before a meal. Takes 0.5 tablet   Yes [provider]  ibuprofen (ADVIL,MOTRIN) 200 MG tablet Take 800 mg by mouth daily as needed for moderate pain.   Yes [provider]  Insulin Glargine (BASAGLAR KWIKPEN [place]) Inject 52 Units into the skin at bedtime.    Yes [provider]  LORazepam (ATIVAN) 1 MG tablet Take 1 mg by mouth 2 (two) times daily as needed for anxiety (takes 1 tablet every night and 1 tablet in day only if needed for anxiety).    Yes [provider]  metFORMIN (GLUCOPHAGE) 1000 MG tablet Take 1,000 mg by mouth 2 (two) times  daily.  09/19/16  Yes [provider]  naphazoline-glycerin (CLEAR EYES REDNESS) 0.012-0.2 % SOLN Place 1-2 drops into both eyes 4 (four) times daily as needed for eye irritation.   Yes [provider]  aspirin 81 MG chewable tablet Chew 81 mg by mouth daily.     [provider]    Allergies as of 01/26/2017 - Review Complete 01/26/2017  Allergen Reaction Noted  . Excedrin extra strength [asa-apap-caff buffered] Diarrhea and Nausea Only 11/17/2016  . Statins Other (See Comments) 11/17/2016  . Wellbutrin [bupropion] Hives 11/17/2016    Family History  Problem Relation Age of Onset  . Heart failure Mother   . Heart disease Father   . Cancer Father        Lung  . Multiple sclerosis Daughter   . Diabetes Paternal Aunt   . Heart disease Maternal Grandmother   . Heart disease Maternal Grandfather   . Diabetes Paternal Grandmother   . Heart disease Paternal Grandmother   . Heart disease Paternal Grandfather   . Colon cancer Neg Hx   . Colon polyps Neg Hx     Social History   Socioeconomic History  . Marital status: Divorced    Spouse name: Not on file  . Number of children: Not on file  . Years of education: Not on file  .  Highest education level: Not on file  Social Needs  . Financial resource strain: Not on file  . Food insecurity - worry: Not on file  . Food insecurity - inability: Not on file  . Transportation needs - medical: Not on file  . Transportation needs - non-medical: Not on file  Occupational History  . 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 Smoking status: Former Smoker    Packs/day: 1.00    Years: 26.00    Pack years: 26.00    Types: Cigarettes    Last attempt to quit: 12/09/2004    Years since quitting: 12.2  . Smokeless tobacco: Never Used  Substance and Sexual Activity  . Alcohol use: No    Frequency: Never  . Drug use: No  . Sexual activity: No    Birth control/protection: None   Other Topics Concern  . Not on file  Social History Narrative  . Not on file    Review of Systems: See HPI, otherwise negative ROS  Physical Exam: BP 120/70   Pulse 79   Temp 98 F (36.7 C) (Oral)   Resp 18   SpO2 95%  General:   Alert,  Well-developed, well-nourished, pleasant and cooperative in NAD Neck:  Supple; no masses or thyromegaly. No significant cervical adenopathy. Lungs:  Clear throughout to auscultation.   No wheezes, crackles, or rhonchi. No acute distress. Heart:  Regular rate and rhythm; no murmurs, clicks, rubs,  or gallops. Abdomen: Non-distended, normal bowel sounds.  Soft and nontender without appreciable mass or hepatosplenomegaly.  Pulses:  Normal pulses noted. Extremities:  Without clubbing or edema.  Impression:  Pleasant 67 year old lady with oropharyngeal and esophageal dysphagia, poorly controlled GERD. Recommendations:  I've offered the patient an EGD with possible esophageal dilation.  The risks, benefits, limitations, alternatives and imponderables have been reviewed with the patient. Potential for esophageal dilation, biopsy, etc. have also been reviewed.  Questions have been answered. All parties agreeable.  Notice: This dictation was prepared with Dragon dictation along with smaller phrase technology. Any transcriptional errors that result from this process are unintentional and may not be corrected upon review.

## 2017-03-10 NOTE — Discharge Instructions (Signed)
Pantoprazole tablets What is this medicine? PANTOPRAZOLE (pan TOE pra zole) prevents the production of acid in the stomach. It is used to treat gastroesophageal reflux disease (GERD), inflammation of the esophagus, and Zollinger-Ellison syndrome. This medicine may be used for other purposes; ask your health care provider or pharmacist if you have questions. COMMON BRAND NAME(S): Protonix What should I tell my health care provider before I take this medicine? They need to know if you have any of these conditions: -liver disease -low levels of magnesium in the blood -lupus -an unusual or allergic reaction to omeprazole, lansoprazole, pantoprazole, rabeprazole, other medicines, foods, dyes, or preservatives -pregnant or trying to get pregnant -breast-feeding How should I use this medicine? Take this medicine by mouth. Swallow the tablets whole with a drink of water. Follow the directions on the prescription label. Do not crush, break, or chew. Take your medicine at regular intervals. Do not take your medicine more often than directed. Talk to your pediatrician regarding the use of this medicine in children. While this drug may be prescribed for children as [place] as 5 years for selected conditions, precautions do apply. Overdosage: If you think you have taken too much of this medicine contact a poison control center or emergency room at once. NOTE: This medicine is only for you. Do not share this medicine with others. What if I miss a dose? If you miss a dose, take it as soon as you can. If it is almost time for your next dose, take only that dose. Do not take double or extra doses. What may interact with this medicine? Do not take this medicine with any of the following medications: -atazanavir -nelfinavir This medicine may also interact with the following medications: -ampicillin -delavirdine -erlotinib -iron salts -medicines for fungal infections like ketoconazole, itraconazole and  voriconazole -methotrexate -mycophenolate mofetil -warfarin This list may not describe all possible interactions. Give your health care provider a list of all the medicines, herbs, non-prescription drugs, or dietary supplements you use. Also tell them if you smoke, drink alcohol, or use illegal drugs. Some items may interact with your medicine. What should I watch for while using this medicine? It can take several days before your stomach pain gets better. Check with your doctor or health care professional if your condition does not start to get better, or if it gets worse. You may need blood work done while you are taking this medicine. What side effects may I notice from receiving this medicine? Side effects that you should report to your doctor or health care professional as soon as possible: -allergic reactions like skin rash, itching or hives, swelling of the face, lips, or tongue -bone, muscle or joint pain -breathing problems -chest pain or chest tightness -dark yellow or brown urine -dizziness -fast, irregular heartbeat -feeling faint or lightheaded -fever or sore throat -muscle spasm -palpitations -rash on cheeks or arms that gets worse in the sun -redness, blistering, peeling or loosening of the skin, including inside the mouth -seizures -tremors -unusual bleeding or bruising -unusually weak or tired -yellowing of the eyes or skin Side effects that usually do not require medical attention (report to your doctor or health care professional if they continue or are bothersome): -constipation -diarrhea -dry mouth -headache -nausea This list may not describe all possible side effects. Call your doctor for medical advice about side effects. You may report side effects to FDA at 1-800-FDA-1088. Where should I keep my medicine? Keep out of the reach of children. Store at room   temperature between 15 and 30 degrees C (59 and 86 degrees F). Protect from light and moisture. Throw  away any unused medicine after the expiration date. NOTE: This sheet is a summary. It may not cover all possible information. If you have questions about this medicine, talk to your doctor, pharmacist, or health care provider.  2018 Elsevier/Gold Standard (2015-02-27 12:20:19) Food Choices for Gastroesophageal Reflux Disease, Adult When you have gastroesophageal reflux disease (GERD), the foods you eat and your eating habits are very important. Choosing the right foods can help ease your discomfort. What guidelines do I need to follow?  Choose fruits, vegetables, whole grains, and low-fat dairy products.  Choose low-fat meat, fish, and poultry.  Limit fats such as oils, salad dressings, butter, nuts, and avocado.  Keep a food diary. This helps you identify foods that cause symptoms.  Avoid foods that cause symptoms. These may be different for everyone.  Eat small meals often instead of 3 large meals a day.  Eat your meals slowly, in a place where you are relaxed.  Limit fried foods.  Cook foods using methods other than frying.  Avoid drinking alcohol.  Avoid drinking large amounts of liquids with your meals.  Avoid bending over or lying down until 2-3 hours after eating. What foods are not recommended? These are some foods and drinks that may make your symptoms worse: Vegetables Tomatoes. Tomato juice. Tomato and spaghetti sauce. Chili peppers. Onion and garlic. Horseradish. Fruits Oranges, grapefruit, and lemon (fruit and juice). Meats High-fat meats, fish, and poultry. This includes hot dogs, ribs, ham, sausage, salami, and bacon. Dairy Whole milk and chocolate milk. Sour cream. Cream. Butter. Ice cream. Cream cheese. Drinks Coffee and tea. Bubbly (carbonated) drinks or energy drinks. Condiments Hot sauce. Barbecue sauce. Sweets/Desserts Chocolate and cocoa. Donuts. Peppermint and spearmint. Fats and Oils High-fat foods. This includes [place] fries and potato  chips. Other Vinegar. Strong spices. This includes black pepper, white pepper, red pepper, cayenne, curry powder, cloves, ginger, and chili powder. The items listed above may not be a complete list of foods and drinks to avoid. Contact your dietitian for more information. This information is not intended to replace advice given to you by your health care provider. Make sure you discuss any questions you have with your health care provider. Document Released: 07/27/2011 Document Revised: 07/03/2015 Document Reviewed: 11/29/2012 Elsevier Interactive Patient Education  2017 Elsevier Inc. Gastroesophageal Reflux Disease, Adult Normally, food travels down the esophagus and stays in the stomach to be digested. If a person has gastroesophageal reflux disease (GERD), food and stomach acid move back up into the esophagus. When this happens, the esophagus becomes sore and swollen (inflamed). Over time, GERD can make small holes (ulcers) in the lining of the esophagus. Follow these instructions at home: Diet  Follow a diet as told by your doctor. You may need to avoid foods and drinks such as: ? Coffee and tea (with or without caffeine). ? Drinks that contain alcohol. ? Energy drinks and sports drinks. ? Carbonated drinks or sodas. ? Chocolate and cocoa. ? Peppermint and mint flavorings. ? Garlic and onions. ? Horseradish. ? Spicy and acidic foods, such as peppers, chili powder, curry powder, vinegar, hot sauces, and BBQ sauce. ? Citrus fruit juices and citrus fruits, such as oranges, lemons, and limes. ? Tomato-based foods, such as red sauce, chili, salsa, and pizza with red sauce. ? Fried and fatty foods, such as donuts, french fries, potato chips, and high-fat dressings. ? High-fat meats, such  as hot dogs, rib eye steak, sausage, ham, and bacon. ? High-fat dairy items, such as whole milk, butter, and cream cheese.  Eat small meals often. Avoid eating large meals.  Avoid drinking large amounts  of liquid with your meals.  Avoid eating meals during the 2-3 hours before bedtime.  Avoid lying down right after you eat.  Do not exercise right after you eat. General instructions  Pay attention to any changes in your symptoms.  Take over-the-counter and prescription medicines only as told by your doctor. Do not take aspirin, ibuprofen, or other NSAIDs unless your doctor says it is okay.  Do not use any tobacco products, including cigarettes, chewing tobacco, and e-cigarettes. If you need help quitting, ask your doctor.  Wear loose clothes. Do not wear anything tight around your waist.  Raise (elevate) the head of your bed about 6 inches (15 cm).  Try to lower your stress. If you need help doing this, ask your doctor.  If you are overweight, lose an amount of weight that is healthy for you. Ask your doctor about a safe weight loss goal.  Keep all follow-up visits as told by your doctor. This is important. Contact a doctor if:  You have new symptoms.  You lose weight and you do not know why it is happening.  You have trouble swallowing, or it hurts to swallow.  You have wheezing or a cough that keeps happening.  Your symptoms do not get better with treatment.  You have a hoarse voice. Get help right away if:  You have pain in your arms, neck, jaw, teeth, or back.  You feel sweaty, dizzy, or light-headed.  You have chest pain or shortness of breath.  You throw up (vomit) and your throw up looks like blood or coffee grounds.  You pass out (faint).  Your poop (stool) is bloody or black.  You cannot swallow, drink, or eat. This information is not intended to replace advice given to you by your health care provider. Make sure you discuss any questions you have with your health care provider. Document Released: 07/14/2007 Document Revised: 07/03/2015 Document Reviewed: 05/22/2014 Elsevier Interactive Patient Education  2018 [employer]. EGD Discharge  instructions Please read the instructions outlined below and refer to this sheet in the next few weeks. These discharge instructions provide you with general information on caring for yourself after you leave the hospital. Your doctor may also give you specific instructions. While your treatment has been planned according to the most current medical practices available, unavoidable complications occasionally occur. If you have any problems or questions after discharge, please call your doctor. ACTIVITY  You may resume your regular activity but move at a slower pace for the next 24 hours.   Take frequent rest periods for the next 24 hours.   Walking will help expel (get rid of) the air and reduce the bloated feeling in your abdomen.   No driving for 24 hours (because of the anesthesia (medicine) used during the test).   You may shower.   Do not sign any important legal documents or operate any machinery for 24 hours (because of the anesthesia used during the test).  NUTRITION  Drink plenty of fluids.   You may resume your normal diet.   Begin with a light meal and progress to your normal diet.   Avoid alcoholic beverages for 24 hours or as instructed by your caregiver.  MEDICATIONS  You may resume your normal medications unless your caregiver tells  you otherwise.  WHAT YOU CAN EXPECT TODAY  You may experience abdominal discomfort such as a feeling of fullness or gas pains.  FOLLOW-UP  Your doctor will discuss the results of your test with you.  SEEK IMMEDIATE MEDICAL ATTENTION IF ANY OF THE FOLLOWING OCCUR:  Excessive nausea (feeling sick to your stomach) and/or vomiting.   Severe abdominal pain and distention (swelling).   Trouble swallowing.   Temperature over 101 F (37.8 C).   Rectal bleeding or vomiting of blood.    GERD information provided  Begin Protonix 40 mg daily. Take every day.  Office visit with us in  3 Months.  Further recommendations to to follow  pending results of biopsies.

## 2017-03-10 NOTE — Anesthesia Preprocedure Evaluation (Signed)
Anesthesia Evaluation  Patient identified by MRN, date of birth, ID band Patient awake    Reviewed: Allergy & Precautions, NPO status , Patient's Chart, lab work & pertinent test results  Airway Mallampati: II  TM Distance: >3 FB Neck ROM: Full    Dental  (+) Teeth Intact   Pulmonary sleep apnea , former smoker,    breath sounds clear to auscultation       Cardiovascular  Rhythm:Regular Rate:Normal     Neuro/Psych PSYCHIATRIC DISORDERS Depression    GI/Hepatic GERD  Medicated,  Endo/Other  diabetes, Type 2, Oral Hypoglycemic AgentsMorbid obesity  Renal/GU      Musculoskeletal  (+) Arthritis ,   Abdominal   Peds  Hematology   Anesthesia Other Findings   Reproductive/Obstetrics                             Anesthesia Physical Anesthesia Plan  ASA: III  Anesthesia Plan: MAC   Post-op Pain Management:    Induction: Intravenous  PONV Risk Score and Plan:   Airway Management Planned: Simple Face Mask  Additional Equipment:   Intra-op Plan:   Post-operative Plan:   Informed Consent: I have reviewed the patients History and Physical, chart, labs and discussed the procedure including the risks, benefits and alternatives for the proposed anesthesia with the patient or authorized representative who has indicated his/her understanding and acceptance.     Plan Discussed with:   Anesthesia Plan Comments:         Anesthesia Quick Evaluation

## 2017-03-10 NOTE — Anesthesia Postprocedure Evaluation (Signed)
Anesthesia Post Note  Patient: Maria Williamson  Procedure(s) Performed: ESOPHAGOGASTRODUODENOSCOPY (EGD) WITH PROPOFOL (N/A ) MALONEY DILATION (N/A ) BIOPSY  Patient location during evaluation: PACU Anesthesia Type: MAC Level of consciousness: awake and alert Pain management: pain level controlled Vital Signs Assessment: post-procedure vital signs reviewed and stable Respiratory status: spontaneous breathing, nonlabored ventilation and respiratory function stable Cardiovascular status: blood pressure returned to baseline Postop Assessment: no apparent nausea or vomiting Anesthetic complications: no     Last Vitals:  Vitals:   03/10/17 0715 03/10/17 0720  BP: 125/71 120/70  Pulse:    Resp: 16 18  Temp:    SpO2: 97% 95%    Last Pain:  Vitals:   03/10/17 0649  TempSrc: Oral                 Jonathan Kirkendoll J

## 2017-03-10 NOTE — Transfer of Care (Signed)
Immediate Anesthesia Transfer of Care Note  Patient: Maria Williamson  Procedure(s) Performed: ESOPHAGOGASTRODUODENOSCOPY (EGD) WITH PROPOFOL (N/A ) MALONEY DILATION (N/A ) BIOPSY  Patient Location: PACU  Anesthesia Type:MAC  Level of Consciousness: awake and patient cooperative  Airway & Oxygen Therapy: Patient Spontanous Breathing and Patient connected to face mask oxygen  Post-op Assessment: Report given to RN, Post -op Vital signs reviewed and stable and Patient moving all extremities  Post vital signs: Reviewed and stable  Last Vitals:  Vitals:   03/10/17 0715 03/10/17 0720  BP: 125/71 120/70  Pulse:    Resp: 16 18  Temp:    SpO2: 97% 95%    Last Pain:  Vitals:   03/10/17 0649  TempSrc: Oral         Complications: No apparent anesthesia complications

## 2017-03-10 NOTE — Op Note (Signed)
[facility] Patient Name: Maria Williamson Procedure Date: 03/10/2017 7:08 AM MRN: [id number] Date of Birth: [birth year] Attending MD: Gennette Pac , MD CSN: [id number] Age: 67 Admit Type: Outpatient Procedure:                Upper GI endoscopy Indications:              Dysphagia Providers:                Gennette Pac, MD, Criselda Peaches. Patsy Lager, RN,                            Dyann Ruddle Referring MD:              Medicines:                Propofol per Anesthesia Complications:            No immediate complications. Estimated Blood Loss:     Estimated blood loss was minimal. Procedure:                Pre-Anesthesia Assessment:                           - Prior to the procedure, a History and Physical                            was performed, and patient medications and                            allergies were reviewed. The patient's tolerance of                            previous anesthesia was also reviewed. The risks                            and benefits of the procedure and the sedation                            options and risks were discussed with the patient.                            All questions were answered, and informed consent                            was obtained. Prior Anticoagulants: The patient has                            taken no previous anticoagulant or antiplatelet                            agents. ASA Grade Assessment: II - A patient with                            mild systemic disease. After reviewing the risks  and benefits, the patient was deemed in                            satisfactory condition to undergo the procedure.                           After obtaining informed consent, the endoscope was                            passed under direct vision. Throughout the                            procedure, the patient's blood pressure, pulse, and                            oxygen saturations were monitored  continuously. The                            EG-[id number] ([id number]) scope was introduced through the                            and advanced to the second part of duodenum. The                            upper GI endoscopy was accomplished without                            difficulty. The patient tolerated the procedure                            well.                           . Scope In: 7:46:00 AM Scope Out: 7:56:14 AM Total Procedure Duration: 0 hours 10 minutes 14 seconds  Findings:      Esophagitis was found. circumferential distal esophageal erosions within       5 mm the GE junction. No Barrett's epithelium seen. The scope was       withdrawn. Dilation was performed with a Maloney dilator with mild       resistance at 54 Fr. The dilation site was examined following endoscope       reinsertion and showed no change. The scope was withdrawn. Dilation was       performed with a Maloney dilator with mild resistance at 56 Fr. The       dilation site was examined following endoscope reinsertion and showed       mild improvement in luminal narrowing. Estimated blood loss was minimal.      Multiple erosions were found in the stomach. This was biopsied with a       cold forceps for histology. Estimated blood loss was minimal.      The duodenal bulb and second portion of the duodenum were normal.      A mild Schatzki ring (acquired) was found at the gastroesophageal       junction. Impression:               -  Erosive Reflux Esophagitis. Dilated.                           - Erosive gastropathy. Biopsied.                           - Normal duodenal bulb and second portion of the                            duodenum.                           - Mild Schatzki ring. Moderate Sedation:      Moderate (conscious) sedation was personally administered by an       anesthesia professional. The following parameters were monitored: oxygen       saturation, heart rate, blood pressure, respiratory rate,  EKG, adequacy       of pulmonary ventilation, and response to care. Total physician       intraservice time was 19 minutes. Recommendation:           - Patient has a contact number available for                            emergencies. The signs and symptoms of potential                            delayed complications were discussed with the                            patient. Return to normal activities tomorrow.                            Written discharge instructions were provided to the                            patient.                           - Advance diet as tolerated.                           - Use Protonix (pantoprazole) 40 mg PO daily daily.                            VISIT with us in 3 months.                           - Continue present medications. Procedure Code(s):        --- Professional ---                           [id number], Esophagogastroduodenoscopy, flexible,                            transoral; with biopsy, single or multiple  43450, Dilation of esophagus, by unguided sound or                            bougie, single or multiple passes Diagnosis Code(s):        --- Professional ---                           K20.9, Esophagitis, unspecified                           K31.89, Other diseases of stomach and duodenum                           K22.2, Esophageal obstruction                           R13.10, Dysphagia, unspecified CPT copyright 2016 American Medical Association. All rights reserved. The codes documented in this report are preliminary and upon coder review may  be revised to meet current compliance requirements. Gerrit Friends. Caileen Veracruz, MD Gennette Pac, MD 03/10/2017 8:09:27 AM This report has been signed electronically. Number of Addenda: 0

## 2017-03-14 ENCOUNTER — Encounter (HOSPITAL_COMMUNITY): Payer: Self-pay | Admitting: Internal Medicine

## 2017-03-14 ENCOUNTER — Encounter: Payer: Self-pay | Admitting: Internal Medicine

## 2017-03-23 DIAGNOSIS — E1165 Type 2 diabetes mellitus with hyperglycemia: Secondary | ICD-10-CM | POA: Diagnosis not present

## 2017-03-25 DIAGNOSIS — E1165 Type 2 diabetes mellitus with hyperglycemia: Secondary | ICD-10-CM | POA: Diagnosis not present

## 2017-03-25 DIAGNOSIS — F330 Major depressive disorder, recurrent, mild: Secondary | ICD-10-CM | POA: Diagnosis not present

## 2017-03-25 DIAGNOSIS — E785 Hyperlipidemia, unspecified: Secondary | ICD-10-CM | POA: Diagnosis not present

## 2017-03-30 ENCOUNTER — Ambulatory Visit: Payer: Medicare Other | Admitting: Gastroenterology

## 2017-04-08 DIAGNOSIS — E113293 Type 2 diabetes mellitus with mild nonproliferative diabetic retinopathy without macular edema, bilateral: Secondary | ICD-10-CM | POA: Diagnosis not present

## 2017-04-22 DIAGNOSIS — Z6835 Body mass index (BMI) 35.0-35.9, adult: Secondary | ICD-10-CM | POA: Diagnosis not present

## 2017-04-22 DIAGNOSIS — E1165 Type 2 diabetes mellitus with hyperglycemia: Secondary | ICD-10-CM | POA: Diagnosis not present

## 2017-04-22 DIAGNOSIS — F330 Major depressive disorder, recurrent, mild: Secondary | ICD-10-CM | POA: Diagnosis not present

## 2017-05-26 DIAGNOSIS — Z01818 Encounter for other preprocedural examination: Secondary | ICD-10-CM | POA: Diagnosis not present

## 2017-05-26 DIAGNOSIS — H02413 Mechanical ptosis of bilateral eyelids: Secondary | ICD-10-CM | POA: Diagnosis not present

## 2017-06-06 ENCOUNTER — Encounter: Payer: Self-pay | Admitting: Nurse Practitioner

## 2017-06-06 ENCOUNTER — Ambulatory Visit (INDEPENDENT_AMBULATORY_CARE_PROVIDER_SITE_OTHER): Payer: Medicare Other | Admitting: Nurse Practitioner

## 2017-06-06 VITALS — BP 133/83 | HR 85 | Temp 97.8°F | Ht 63.0 in | Wt 226.4 lb

## 2017-06-06 DIAGNOSIS — K219 Gastro-esophageal reflux disease without esophagitis: Secondary | ICD-10-CM | POA: Diagnosis not present

## 2017-06-06 DIAGNOSIS — R1319 Other dysphagia: Secondary | ICD-10-CM

## 2017-06-06 NOTE — Progress Notes (Signed)
Referring Provider: Benita Stabile, MD Primary Care Physician:  Benita Stabile, MD Primary GI:  Dr. Jena Gauss  Chief Complaint  Patient presents with  . Dysphagia    f/u. doing okay    HPI:   Maria Williamson is a 67 y.o. female who presents for follow-up on dysphasia.  The patient was last seen in our office 01/26/2017 for the same as well as fatty liver disease.  Persistent globus sensation.  Solid food dysphagia and "choking on liquids."  On Tums for chronic reflux, no PPI.  Noted chronic history of reflux, history of IBS, significant stress.  Her IBS symptoms were noted to be generally well controlled with intermittent urgent bowel movement that is "fluffy and with mucus".  Colonoscopy in 2006 in [place] reportedly normal.  Denied rectal bleeding, not interested in routine screening colonoscopy.  EGD was completed 03/10/2017 on propofol which found erosive reflux esophagitis status post dilation, erosive gastropathy status post biopsy, normal duodenum.  Surgical pathology found the biopsies to be antral mucosa with reactive gastropathy and mild inflammation.  Recommended Protonix 40 mg daily, follow-up in 3 months, continue other medications.  Today she states she's doing well overall. Dysphagia doing well. No further issues. She does have significant seasonal allergies and notes persistent mucus. Has diarrhea on Protonix (also on Metformin). Metformin dose was decreased due to chronic diarrhea. Not currently on any PPI. Currently takes TUMS prn (but not every day). Has regular "upset stomach" which she attributes to diabetes and sinus draining. No GERD symptoms. Denies abdominal pain, vomiting, hematochezia, melena, fever, chills, unintentional weight loss. Denies chest pain, dyspnea, dizziness, lightheadedness, syncope, near syncope. Denies any other upper or lower GI symptoms.  Past Medical History:  Diagnosis Date  . Depression   . Diabetes mellitus without complication (HCC)   . GERD  (gastroesophageal reflux disease)   . Hypercholesterolemia   . Osteoarthritis   . Sleep apnea    cannot tolerate CPAP    Past Surgical History:  Procedure Laterality Date  . BIOPSY  03/10/2017   Procedure: BIOPSY;  Surgeon: Corbin Ade, MD;  Location: [facility];  Service: Endoscopy;;  gastric  . ESOPHAGOGASTRODUODENOSCOPY (EGD) WITH PROPOFOL N/A 03/10/2017   Procedure: ESOPHAGOGASTRODUODENOSCOPY (EGD) WITH PROPOFOL;  Surgeon: Corbin Ade, MD;  Location: [facility];  Service: Endoscopy;  Laterality: N/A;  8:30AM  . HYSTERECTOMY ABDOMINAL WITH SALPINGECTOMY    . KNEE SURGERY Left   . MALONEY DILATION N/A 03/10/2017   Procedure: [name] DILATION;  Surgeon: Corbin Ade, MD;  Location: [facility];  Service: Endoscopy;  Laterality: N/A;  . NASAL SEPTUM SURGERY    . TUBAL LIGATION      Current Outpatient Medications  Medication Sig Dispense Refill  . aspirin EC 81 MG tablet Take 81 mg by mouth daily.    . citalopram (CELEXA) 40 MG tablet Take 40 mg by mouth at bedtime.     . diphenhydrAMINE (BENADRYL) 25 MG tablet Take 12.5 mg by mouth 2 (two) times daily as needed for allergies.    . furosemide (LASIX) 20 MG tablet Take 10 mg by mouth daily.     Marland Kitchen glipiZIDE (GLUCOTROL) 10 MG tablet Take 10 mg by mouth 2 (two) times daily before a meal. Takes 0.5 tablet    . Insulin Degludec (TRESIBA) 100 UNIT/ML SOLN Inject 80 Units into the skin at bedtime.    . liraglutide (VICTOZA) 18 MG/3ML SOPN Inject 1.2 mg into the skin daily.    Marland Kitchen  LORazepam (ATIVAN) 1 MG tablet Take 1 mg by mouth 2 (two) times daily as needed for anxiety (takes 1 tablet every night and 1 tablet in day only if needed for anxiety).     . metFORMIN (GLUCOPHAGE) 1000 MG tablet Take 1,000 mg by mouth daily.     . naphazoline-glycerin (CLEAR EYES REDNESS) 0.012-0.2 % SOLN Place 1-2 drops into both eyes 4 (four) times daily as needed for eye irritation.     No current facility-administered medications for this visit.       Allergies as of 06/06/2017 - Review Complete 06/06/2017  Allergen Reaction Noted  . Excedrin extra strength [asa-apap-caff buffered] Diarrhea and Nausea Only 11/17/2016  . Statins Other (See Comments) 11/17/2016  . Wellbutrin [bupropion] Hives 11/17/2016    Family History  Problem Relation Age of Onset  . Heart failure Mother   . Heart disease Father   . Cancer Father        Lung  . Multiple sclerosis Daughter   . Diabetes Paternal Aunt   . Heart disease Maternal Grandmother   . Heart disease Maternal Grandfather   . Diabetes Paternal Grandmother   . Heart disease Paternal Grandmother   . Heart disease Paternal Grandfather   . Colon cancer Neg Hx   . Colon polyps Neg Hx     Social History   Socioeconomic History  . Marital status: Divorced    Spouse name: Not on file  . Number of children: Not on file  . Years of education: Not on file  . Highest education level: Not on file  Occupational History  . Occupation: retired    Comment: Medical laboratory scientific officer, [employer], worked in a lab   Social Needs  . Financial resource strain: Not on file  . Food insecurity:    Worry: Not on file    Inability: Not on file  . Transportation needs:    Medical: Not on file    Non-medical: Not on file  Tobacco Use  . Smoking status: Former Smoker    Packs/day: 1.00    Years: 26.00    Pack years: 26.00    Types: Cigarettes    Last attempt to quit: 12/09/2004    Years since quitting: 12.4  . Smokeless tobacco: Never Used  Substance and Sexual Activity  . Alcohol use: No    Frequency: Never  . Drug use: No  . Sexual activity: Never    Birth control/protection: None  Lifestyle  . Physical activity:    Days per week: Not on file    Minutes per session: Not on file  . Stress: Not on file  Relationships  . Social connections:    Talks on phone: Not on file    Gets together: Not on file    Attends religious service: Not on file    Active member of club or organization: Not on file    Attends  meetings of clubs or organizations: Not on file    Relationship status: Not on file  Other Topics Concern  . Not on file  Social History Narrative  . Not on file    Review of Systems: General: Negative for anorexia, weight loss, fever, chills, fatigue, weakness. ENT: Negative for hoarseness, difficulty swallowing , nasal congestion. CV: Negative for chest pain, angina, palpitations, dyspnea on exertion, peripheral edema.  Respiratory: Negative for dyspnea at rest, dyspnea on exertion, cough, sputum, wheezing.  GI: See history of present illness. Endo: Negative for unusual weight change.   Physical Exam: BP 133/83  Pulse 85   Temp 97.8 F (36.6 C) (Oral)   Ht  (1.6 m)   Wt 226 lb 6.4 oz (102.7 kg)   BMI 40.10 kg/m  General:   Alert and oriented. Pleasant and cooperative. Well-nourished and well-developed.  Eyes:  Without icterus, sclera clear and conjunctiva pink.  Ears:  Normal auditory acuity. Cardiovascular:  S1, S2 present without murmurs appreciated. Extremities without clubbing or edema. Respiratory:  Clear to auscultation bilaterally. No wheezes, rales, or rhonchi. No distress.  Gastrointestinal:  +BS, soft, non-tender and non-distended. No HSM noted. No guarding or rebound. No masses appreciated.  Rectal:  Deferred  Musculoskalatal:  Symmetrical without gross deformities. Neurologic:  Alert and oriented x4;  grossly normal neurologically. Psych:  Alert and cooperative. Normal mood and affect. Heme/Lymph/Immune: No excessive bruising noted.    06/06/2017 9:27 AM   Disclaimer: This note was dictated with voice recognition software. Similar sounding words can inadvertently be transcribed and may not be corrected upon review.

## 2017-06-06 NOTE — Assessment & Plan Note (Signed)
Dysphasia significantly improved since EGD with dilation, essentially rare to no symptoms since.  She does have persistent mucus drainage which causes coughing and choking, but she feels this is due to her allergies and not recurrent dysphasia.  Continue to monitor symptoms.  Follow-up in 6 months.

## 2017-06-06 NOTE — Assessment & Plan Note (Signed)
GERD symptoms are generally well controlled on H2 receptor blockers.  However, we discussed the possibility of worsening GERD and esophageal reflux as an etiology behind her dysphagia.  She tried Protonix and it caused diarrhea.  She is okay with trying over-the-counter PPIs for tolerance.  She will call us when she finds one that does not cause diarrhea that we can send in a prescription at that point.  Return for follow-up in 6 months.

## 2017-06-06 NOTE — Patient Instructions (Signed)
1. Try over-the-counter acid blockers, including: Prilosec, Nexium, Prevacid. 2. If none of these are tolerable, there are other prescription only options that we can trial as well. 3. Call us if you identify an over-the-counter acid blocker listed above that you are able to tolerate. 4. Return for follow-up in 6 months. 5. Call us if you have any questions or concerns before then.  At [facility] we value your feedback. You may receive a survey about your visit today. Please share your experience as we strive to create trusting relationships with our patients to provide genuine, compassionate, quality care.  It was great to see you today!  I hope you have a wonderful summer!!

## 2017-06-06 NOTE — Progress Notes (Signed)
CC'D TO PCP °

## 2017-06-20 DIAGNOSIS — H02412 Mechanical ptosis of left eyelid: Secondary | ICD-10-CM | POA: Diagnosis not present

## 2017-06-20 DIAGNOSIS — H02403 Unspecified ptosis of bilateral eyelids: Secondary | ICD-10-CM | POA: Diagnosis not present

## 2017-06-20 DIAGNOSIS — H02413 Mechanical ptosis of bilateral eyelids: Secondary | ICD-10-CM | POA: Diagnosis not present

## 2017-06-20 DIAGNOSIS — H02411 Mechanical ptosis of right eyelid: Secondary | ICD-10-CM | POA: Diagnosis not present

## 2017-06-23 DIAGNOSIS — E119 Type 2 diabetes mellitus without complications: Secondary | ICD-10-CM | POA: Diagnosis not present

## 2017-06-23 DIAGNOSIS — I10 Essential (primary) hypertension: Secondary | ICD-10-CM | POA: Diagnosis not present

## 2017-06-23 DIAGNOSIS — E782 Mixed hyperlipidemia: Secondary | ICD-10-CM | POA: Diagnosis not present

## 2017-06-23 DIAGNOSIS — E1165 Type 2 diabetes mellitus with hyperglycemia: Secondary | ICD-10-CM | POA: Diagnosis not present

## 2017-06-27 DIAGNOSIS — Z6834 Body mass index (BMI) 34.0-34.9, adult: Secondary | ICD-10-CM | POA: Diagnosis not present

## 2017-06-27 DIAGNOSIS — E782 Mixed hyperlipidemia: Secondary | ICD-10-CM | POA: Diagnosis not present

## 2017-06-27 DIAGNOSIS — E1165 Type 2 diabetes mellitus with hyperglycemia: Secondary | ICD-10-CM | POA: Diagnosis not present

## 2017-06-27 DIAGNOSIS — F330 Major depressive disorder, recurrent, mild: Secondary | ICD-10-CM | POA: Diagnosis not present

## 2017-08-03 ENCOUNTER — Emergency Department (HOSPITAL_COMMUNITY): Payer: Medicare Other

## 2017-08-03 ENCOUNTER — Other Ambulatory Visit: Payer: Self-pay

## 2017-08-03 ENCOUNTER — Emergency Department (HOSPITAL_COMMUNITY)
Admission: EM | Admit: 2017-08-03 | Discharge: 2017-08-04 | Disposition: A | Discharge status: Home / Self Care | Payer: Medicare Other | Attending: Emergency Medicine | Admitting: Emergency Medicine

## 2017-08-03 ENCOUNTER — Encounter (HOSPITAL_COMMUNITY): Payer: Self-pay | Admitting: *Deleted

## 2017-08-03 DIAGNOSIS — E119 Type 2 diabetes mellitus without complications: Secondary | ICD-10-CM | POA: Diagnosis not present

## 2017-08-03 DIAGNOSIS — M25552 Pain in left hip: Secondary | ICD-10-CM | POA: Diagnosis not present

## 2017-08-03 DIAGNOSIS — Z87891 Personal history of nicotine dependence: Secondary | ICD-10-CM | POA: Insufficient documentation

## 2017-08-03 DIAGNOSIS — Z7982 Long term (current) use of aspirin: Secondary | ICD-10-CM | POA: Insufficient documentation

## 2017-08-03 DIAGNOSIS — M25551 Pain in right hip: Secondary | ICD-10-CM | POA: Diagnosis not present

## 2017-08-03 DIAGNOSIS — R52 Pain, unspecified: Secondary | ICD-10-CM

## 2017-08-03 DIAGNOSIS — Z79899 Other long term (current) drug therapy: Secondary | ICD-10-CM | POA: Diagnosis not present

## 2017-08-03 DIAGNOSIS — Z794 Long term (current) use of insulin: Secondary | ICD-10-CM | POA: Insufficient documentation

## 2017-08-03 DIAGNOSIS — S79912A Unspecified injury of left hip, initial encounter: Secondary | ICD-10-CM | POA: Diagnosis not present

## 2017-08-03 DIAGNOSIS — S3992XA Unspecified injury of lower back, initial encounter: Secondary | ICD-10-CM | POA: Diagnosis not present

## 2017-08-03 DIAGNOSIS — M545 Low back pain: Secondary | ICD-10-CM | POA: Diagnosis not present

## 2017-08-03 LAB — I-STAT CHEM 8, ED
BUN: 15 mg/dL (ref 8–23)
CREATININE: 0.5 mg/dL (ref 0.44–1.00)
Calcium, Ion: 1.16 mmol/L (ref 1.15–1.40)
Chloride: 101 mmol/L (ref 98–111)
GLUCOSE: 310 mg/dL — AB (ref 70–99)
HCT: 40 % (ref 36.0–46.0)
HEMOGLOBIN: 13.6 g/dL (ref 12.0–15.0)
POTASSIUM: 3.9 mmol/L (ref 3.5–5.1)
Sodium: 135 mmol/L (ref 135–145)
TCO2: 21 mmol/L — ABNORMAL LOW (ref 22–32)

## 2017-08-03 MED ORDER — ONDANSETRON HCL 4 MG/2ML IJ SOLN
4.0000 mg | Freq: Once | INTRAMUSCULAR | Status: AC
  Administered 2017-08-03: 4 mg via INTRAVENOUS
  Filled 2017-08-03: qty 2

## 2017-08-03 MED ORDER — METHYLPREDNISOLONE SODIUM SUCC 125 MG IJ SOLR
125.0000 mg | Freq: Once | INTRAMUSCULAR | Status: AC
  Administered 2017-08-03: 125 mg via INTRAVENOUS
  Filled 2017-08-03: qty 2

## 2017-08-03 MED ORDER — HYDROMORPHONE HCL 1 MG/ML IJ SOLN
1.0000 mg | Freq: Once | INTRAMUSCULAR | Status: AC
  Administered 2017-08-03: 1 mg via INTRAVENOUS
  Filled 2017-08-03: qty 1

## 2017-08-03 MED ORDER — OXYCODONE-ACETAMINOPHEN 5-325 MG PO TABS: 1.0000 | ORAL_TABLET | ORAL | 0 refills | Status: DC | PRN

## 2017-08-03 MED ORDER — PREDNISONE 20 MG PO TABS: ORAL_TABLET | ORAL | 0 refills | Status: DC

## 2017-08-03 NOTE — ED Provider Notes (Signed)
[facility] EMERGENCY DEPARTMENT Provider Note   CSN: [id number] Arrival date & time: 08/03/17  2011     History   Chief Complaint Chief Complaint  Patient presents with  . Hip Pain    HPI Maria Williamson is a 67 y.o. female.  Patient complains of lower back pain with radiation into her left hip down her left leg.  Patient states that she fell couple weeks ago but has been hurting more the last few days.  She has not lost any control over bowels or bladder  The history is provided by the patient. No language interpreter was used.  Hip Pain  This is a new problem. The current episode started more than 2 days ago. The problem occurs constantly. The problem has not changed since onset.Pertinent negatives include no chest pain, no abdominal pain and no headaches. Exacerbated by: Movement. Nothing relieves the symptoms.    Past Medical History:  Diagnosis Date  . Depression   . Diabetes mellitus without complication (HCC)   . GERD (gastroesophageal reflux disease)   . Hypercholesterolemia   . Osteoarthritis   . Sleep apnea    cannot tolerate CPAP    Patient Active Problem List   Diagnosis Date Noted  . GERD (gastroesophageal reflux disease) 06/06/2017  . Other dysphagia 01/26/2017  . Fatty liver 01/26/2017    Past Surgical History:  Procedure Laterality Date  . BIOPSY  03/10/2017   Procedure: BIOPSY;  Surgeon: Corbin Ade, MD;  [facility]: [facility];  Service: Endoscopy;;  gastric  . ESOPHAGOGASTRODUODENOSCOPY (EGD) WITH PROPOFOL N/A 03/10/2017   Procedure: ESOPHAGOGASTRODUODENOSCOPY (EGD) WITH PROPOFOL;  Surgeon: Corbin Ade, MD;  Location: [facility];  Service: Endoscopy;  Laterality: N/A;  8:30AM  . HYSTERECTOMY ABDOMINAL WITH SALPINGECTOMY    . KNEE SURGERY Left   . MALONEY DILATION N/A 03/10/2017   Procedure: [name] DILATION;  Surgeon: Corbin Ade, MD;  Location: [facility];  Service: Endoscopy;  Laterality: N/A;  . NASAL SEPTUM SURGERY    .  TUBAL LIGATION       OB History   None      Home Medications    Prior to Admission medications   Medication Sig Start Date End Date Taking? Authorizing Provider  aspirin EC 81 MG tablet Take 81 mg by mouth daily.    [provider]  citalopram (CELEXA) 40 MG tablet Take 40 mg by mouth at bedtime.     [provider]  diphenhydrAMINE (BENADRYL) 25 MG tablet Take 12.5 mg by mouth 2 (two) times daily as needed for allergies.    [provider]  furosemide (LASIX) 20 MG tablet Take 10 mg by mouth daily.     [provider]  glipiZIDE (GLUCOTROL) 10 MG tablet Take 10 mg by mouth 2 (two) times daily before a meal. Takes 0.5 tablet    [provider]  Insulin Degludec (TRESIBA) 100 UNIT/ML SOLN Inject 80 Units into the skin at bedtime.    [provider]  liraglutide (VICTOZA) 18 MG/3ML SOPN Inject 1.2 mg into the skin daily.    [provider]  LORazepam (ATIVAN) 1 MG tablet Take 1 mg by mouth 2 (two) times daily as needed for anxiety (takes 1 tablet every night and 1 tablet in day only if needed for anxiety).     [provider]  metFORMIN (GLUCOPHAGE) 1000 MG tablet Take 1,000 mg by mouth daily.  09/19/16   [provider]  naphazoline-glycerin (CLEAR  EYES REDNESS) 0.012-0.2 % SOLN Place 1-2 drops into both eyes 4 (four) times daily as needed for eye irritation.    [provider]  oxyCODONE-acetaminophen (PERCOCET/ROXICET) 5-325 MG tablet Take 1 tablet by mouth every 4 (four) hours as needed. 08/03/17   Bethann Berkshire, MD  predniSONE (DELTASONE) 20 MG tablet 2 tabs po daily x 3 days 08/03/17   Bethann Berkshire, MD    Family History Family History  Problem Relation Age of Onset  . Heart failure Mother   . Heart disease Father   . Cancer Father        Lung  . Multiple sclerosis Daughter   . Diabetes Paternal Aunt   . Heart disease Maternal Grandmother   . Heart disease Maternal Grandfather   .  Diabetes Paternal Grandmother   . Heart disease Paternal Grandmother   . Heart disease Paternal Grandfather   . Colon cancer Neg Hx   . Colon polyps Neg Hx     Social History Social History   Tobacco Use  . Smoking status: Former Smoker    Packs/day: 1.00    Years: 26.00    Pack years: 26.00    Types: Cigarettes    Last attempt to quit: 12/09/2004    Years since quitting: 12.6  . Smokeless tobacco: Never Used  Substance Use Topics  . Alcohol use: No    Frequency: Never  . Drug use: No     Allergies   Excedrin extra strength [asa-apap-caff buffered]; Statins; and Wellbutrin [bupropion]   Review of Systems Review of Systems  Constitutional: Negative for appetite change and fatigue.  HENT: Negative for congestion, ear discharge and sinus pressure.   Eyes: Negative for discharge.  Respiratory: Negative for cough.   Cardiovascular: Negative for chest pain.  Gastrointestinal: Negative for abdominal pain and diarrhea.  Genitourinary: Negative for frequency and hematuria.  Musculoskeletal: Positive for back pain.  Skin: Negative for rash.  Neurological: Negative for seizures and headaches.  Psychiatric/Behavioral: Negative for hallucinations.     Physical Exam Updated Vital Signs BP 108/67   Pulse 72   Temp 98.4 F (36.9 C) (Oral)   Resp 18   Ht 5\' 3"  (1.6 m)   Wt 99.8 kg (220 lb)   SpO2 95%   BMI 38.97 kg/m   Physical Exam  Constitutional: She is oriented to person, place, and time. She appears well-developed.  HENT:  Head: Normocephalic.  Eyes: Conjunctivae and EOM are normal. No scleral icterus.  Neck: Neck supple. No thyromegaly present.  Cardiovascular: Normal rate and regular rhythm. Exam reveals no gallop and no friction rub.  No murmur heard. Pulmonary/Chest: No stridor. She has no wheezes. She has no rales. She exhibits no tenderness.  Abdominal: She exhibits no distension. There is no tenderness. There is no rebound.  Musculoskeletal: Normal range  of motion. She exhibits no edema.  Tender lumbar spine with positive straight leg raise on the left  Lymphadenopathy:    She has no cervical adenopathy.  Neurological: She is oriented to person, place, and time. She exhibits normal muscle tone. Coordination normal.  Skin: No rash noted. No erythema.  Psychiatric: She has a normal mood and affect. Her behavior is normal.     ED Treatments / Results  Labs (all labs ordered are listed, but only abnormal results are displayed) Labs Reviewed  I-STAT CHEM 8, ED - Abnormal; Notable for the following components:      Result Value   Glucose, Bld 310 (*)  TCO2 21 (*)    All other components within normal limits    EKG None  Radiology Dg Lumbar Spine Complete  Result Date: 08/03/2017 CLINICAL DATA:  Initial evaluation for acute pain status post recent fall. EXAM: LUMBAR SPINE - COMPLETE 4+ VIEW COMPARISON:  None. FINDINGS: Trace anterolisthesis of L4 on L5, likely chronic and facet mediated. Vertebral bodies otherwise normally aligned with preservation of the normal lumbar lordosis. Vertebral body heights maintained. No evidence for acute traumatic injury. Degenerative intervertebral disc space narrowing with facet arthropathy present at L3-4 through L5-S1. Prominent aortic atherosclerosis. IMPRESSION: 1. No radiographic evidence for acute abnormality within the lumbar spine. 2. Moderate degenerate spondylolysis with facet arthrosis at L3-4 through L5-S1. 3. Aortic atherosclerosis. Electronically Signed   By: Rise MuBenjamin  McClintock M.D.   On: 08/03/2017 21:50   Dg Hip Unilat With Pelvis 2-3 Views Left  Result Date: 08/03/2017 CLINICAL DATA:  Initial evaluation for acute left hip pain status post recent fall. EXAM: DG HIP (WITH OR WITHOUT PELVIS) 2-3V LEFT COMPARISON:  None. FINDINGS: No acute fracture or dislocation. Femoral heads in normal alignment within the acetabula. Femoral head height maintained. Bony pelvis intact. No acute soft tissue  abnormality. Degenerative changes noted within the lower lumbar spine. IMPRESSION: No acute osseous abnormality about the left hip. Electronically Signed   By: Rise MuBenjamin  McClintock M.D.   On: 08/03/2017 21:45    Procedures Procedures (including critical care time)  Medications Ordered in ED Medications  HYDROmorphone (DILAUDID) injection 1 mg (1 mg Intravenous Given 08/03/17 2047)  ondansetron (ZOFRAN) injection 4 mg (4 mg Intravenous Given 08/03/17 2047)  methylPREDNISolone sodium succinate (SOLU-MEDROL) 125 mg/2 mL injection 125 mg (125 mg Intravenous Given 08/03/17 2332)  HYDROmorphone (DILAUDID) injection 1 mg (1 mg Intravenous Given 08/03/17 2332)     Initial Impression / Assessment and Plan / ED Course  I have reviewed the triage vital signs and the nursing notes.  Pertinent labs & imaging results that were available during my care of the patient were reviewed by me and considered in my medical decision making (see chart for details).     With back pain and sciatica.  Patient will be discharged home on prednisone for 3 days and Percocets.  She will rest over the next few days and follow-up with her PCP  Final Clinical Impressions(s) / ED Diagnoses   Final diagnoses:  Right hip pain    ED Discharge Orders        Ordered    oxyCODONE-acetaminophen (PERCOCET/ROXICET) 5-325 MG tablet  Every 4 hours PRN     08/03/17 2338    predniSONE (DELTASONE) 20 MG tablet     08/03/17 2338       Bethann BerkshireZammit, Khyson Sebesta, MD 08/03/17 2342

## 2017-08-03 NOTE — Discharge Instructions (Addendum)
Rest at home over the weekend.  Follow-up with your family doctor next week.  Return if getting worse

## 2017-08-03 NOTE — ED Triage Notes (Signed)
[  pt states that she was getting out of bed a week ago and felt a pain in her left hip, pain has gotten progressively worse since then and today pt states that she is not able to walk on her hip due to the pain,

## 2017-08-22 ENCOUNTER — Ambulatory Visit (HOSPITAL_COMMUNITY)
Admission: RE | Admit: 2017-08-22 | Discharge: 2017-08-22 | Disposition: A | Discharge status: Home / Self Care | Payer: Medicare Other | Source: Ambulatory Visit | Attending: Internal Medicine | Admitting: Internal Medicine

## 2017-08-22 ENCOUNTER — Other Ambulatory Visit: Payer: Self-pay | Admitting: Internal Medicine

## 2017-08-22 DIAGNOSIS — M545 Low back pain: Secondary | ICD-10-CM | POA: Diagnosis not present

## 2017-08-22 DIAGNOSIS — M48061 Spinal stenosis, lumbar region without neurogenic claudication: Secondary | ICD-10-CM | POA: Diagnosis not present

## 2017-08-22 DIAGNOSIS — M5126 Other intervertebral disc displacement, lumbar region: Secondary | ICD-10-CM | POA: Diagnosis not present

## 2017-08-22 DIAGNOSIS — M5430 Sciatica, unspecified side: Secondary | ICD-10-CM | POA: Diagnosis not present

## 2017-08-22 DIAGNOSIS — Z6832 Body mass index (BMI) 32.0-32.9, adult: Secondary | ICD-10-CM | POA: Diagnosis not present

## 2017-08-24 DIAGNOSIS — Z6838 Body mass index (BMI) 38.0-38.9, adult: Secondary | ICD-10-CM | POA: Diagnosis not present

## 2017-08-24 DIAGNOSIS — M5126 Other intervertebral disc displacement, lumbar region: Secondary | ICD-10-CM | POA: Diagnosis not present

## 2017-09-09 ENCOUNTER — Other Ambulatory Visit: Payer: Self-pay

## 2017-10-25 DIAGNOSIS — L309 Dermatitis, unspecified: Secondary | ICD-10-CM | POA: Diagnosis not present

## 2017-10-25 DIAGNOSIS — Z6832 Body mass index (BMI) 32.0-32.9, adult: Secondary | ICD-10-CM | POA: Diagnosis not present

## 2017-11-02 DIAGNOSIS — E785 Hyperlipidemia, unspecified: Secondary | ICD-10-CM | POA: Diagnosis not present

## 2017-11-02 DIAGNOSIS — E1165 Type 2 diabetes mellitus with hyperglycemia: Secondary | ICD-10-CM | POA: Diagnosis not present

## 2017-11-02 DIAGNOSIS — E782 Mixed hyperlipidemia: Secondary | ICD-10-CM | POA: Diagnosis not present

## 2017-11-07 DIAGNOSIS — E113293 Type 2 diabetes mellitus with mild nonproliferative diabetic retinopathy without macular edema, bilateral: Secondary | ICD-10-CM | POA: Diagnosis not present

## 2017-11-07 DIAGNOSIS — F330 Major depressive disorder, recurrent, mild: Secondary | ICD-10-CM | POA: Diagnosis not present

## 2017-11-07 DIAGNOSIS — E782 Mixed hyperlipidemia: Secondary | ICD-10-CM | POA: Diagnosis not present

## 2017-11-07 DIAGNOSIS — Z6832 Body mass index (BMI) 32.0-32.9, adult: Secondary | ICD-10-CM | POA: Diagnosis not present

## 2017-11-07 DIAGNOSIS — Z0000 Encounter for general adult medical examination without abnormal findings: Secondary | ICD-10-CM | POA: Diagnosis not present

## 2017-11-07 DIAGNOSIS — E1165 Type 2 diabetes mellitus with hyperglycemia: Secondary | ICD-10-CM | POA: Diagnosis not present

## 2017-11-23 DIAGNOSIS — Z23 Encounter for immunization: Secondary | ICD-10-CM | POA: Diagnosis not present

## 2017-11-24 ENCOUNTER — Other Ambulatory Visit (HOSPITAL_COMMUNITY): Payer: Self-pay | Admitting: Internal Medicine

## 2017-11-24 DIAGNOSIS — E2839 Other primary ovarian failure: Secondary | ICD-10-CM

## 2017-11-24 DIAGNOSIS — Z1231 Encounter for screening mammogram for malignant neoplasm of breast: Secondary | ICD-10-CM

## 2017-12-07 ENCOUNTER — Ambulatory Visit: Payer: Medicare Other | Admitting: Gastroenterology

## 2017-12-26 DIAGNOSIS — H04123 Dry eye syndrome of bilateral lacrimal glands: Secondary | ICD-10-CM | POA: Diagnosis not present

## 2018-01-26 DIAGNOSIS — H6502 Acute serous otitis media, left ear: Secondary | ICD-10-CM | POA: Diagnosis not present

## 2018-01-26 DIAGNOSIS — J329 Chronic sinusitis, unspecified: Secondary | ICD-10-CM | POA: Diagnosis not present

## 2018-03-24 DIAGNOSIS — E785 Hyperlipidemia, unspecified: Secondary | ICD-10-CM | POA: Diagnosis not present

## 2018-03-24 DIAGNOSIS — E782 Mixed hyperlipidemia: Secondary | ICD-10-CM | POA: Diagnosis not present

## 2018-03-24 DIAGNOSIS — E1165 Type 2 diabetes mellitus with hyperglycemia: Secondary | ICD-10-CM | POA: Diagnosis not present

## 2018-03-28 DIAGNOSIS — K219 Gastro-esophageal reflux disease without esophagitis: Secondary | ICD-10-CM | POA: Diagnosis not present

## 2018-03-28 DIAGNOSIS — E1165 Type 2 diabetes mellitus with hyperglycemia: Secondary | ICD-10-CM | POA: Diagnosis not present

## 2018-03-28 DIAGNOSIS — J309 Allergic rhinitis, unspecified: Secondary | ICD-10-CM | POA: Diagnosis not present

## 2018-03-28 DIAGNOSIS — F330 Major depressive disorder, recurrent, mild: Secondary | ICD-10-CM | POA: Diagnosis not present

## 2018-03-28 DIAGNOSIS — E782 Mixed hyperlipidemia: Secondary | ICD-10-CM | POA: Diagnosis not present

## 2018-04-04 DIAGNOSIS — E782 Mixed hyperlipidemia: Secondary | ICD-10-CM | POA: Diagnosis not present

## 2018-04-04 DIAGNOSIS — F330 Major depressive disorder, recurrent, mild: Secondary | ICD-10-CM | POA: Diagnosis not present

## 2018-04-04 DIAGNOSIS — E1165 Type 2 diabetes mellitus with hyperglycemia: Secondary | ICD-10-CM | POA: Diagnosis not present

## 2018-04-04 DIAGNOSIS — K219 Gastro-esophageal reflux disease without esophagitis: Secondary | ICD-10-CM | POA: Diagnosis not present

## 2018-05-05 DIAGNOSIS — F330 Major depressive disorder, recurrent, mild: Secondary | ICD-10-CM | POA: Diagnosis not present

## 2018-05-05 DIAGNOSIS — K219 Gastro-esophageal reflux disease without esophagitis: Secondary | ICD-10-CM | POA: Diagnosis not present

## 2018-05-05 DIAGNOSIS — E1165 Type 2 diabetes mellitus with hyperglycemia: Secondary | ICD-10-CM | POA: Diagnosis not present

## 2018-05-05 DIAGNOSIS — E782 Mixed hyperlipidemia: Secondary | ICD-10-CM | POA: Diagnosis not present

## 2018-05-11 DIAGNOSIS — E782 Mixed hyperlipidemia: Secondary | ICD-10-CM | POA: Diagnosis not present

## 2018-05-11 DIAGNOSIS — F330 Major depressive disorder, recurrent, mild: Secondary | ICD-10-CM | POA: Diagnosis not present

## 2018-05-11 DIAGNOSIS — E1165 Type 2 diabetes mellitus with hyperglycemia: Secondary | ICD-10-CM | POA: Diagnosis not present

## 2018-05-11 DIAGNOSIS — K219 Gastro-esophageal reflux disease without esophagitis: Secondary | ICD-10-CM | POA: Diagnosis not present

## 2018-06-16 DIAGNOSIS — F330 Major depressive disorder, recurrent, mild: Secondary | ICD-10-CM | POA: Diagnosis not present

## 2018-06-16 DIAGNOSIS — K219 Gastro-esophageal reflux disease without esophagitis: Secondary | ICD-10-CM | POA: Diagnosis not present

## 2018-06-16 DIAGNOSIS — E782 Mixed hyperlipidemia: Secondary | ICD-10-CM | POA: Diagnosis not present

## 2018-06-16 DIAGNOSIS — E1165 Type 2 diabetes mellitus with hyperglycemia: Secondary | ICD-10-CM | POA: Diagnosis not present

## 2018-07-10 DIAGNOSIS — E782 Mixed hyperlipidemia: Secondary | ICD-10-CM | POA: Diagnosis not present

## 2018-07-10 DIAGNOSIS — K219 Gastro-esophageal reflux disease without esophagitis: Secondary | ICD-10-CM | POA: Diagnosis not present

## 2018-07-10 DIAGNOSIS — E1165 Type 2 diabetes mellitus with hyperglycemia: Secondary | ICD-10-CM | POA: Diagnosis not present

## 2018-07-10 DIAGNOSIS — F330 Major depressive disorder, recurrent, mild: Secondary | ICD-10-CM | POA: Diagnosis not present

## 2018-07-21 DIAGNOSIS — E1165 Type 2 diabetes mellitus with hyperglycemia: Secondary | ICD-10-CM | POA: Diagnosis not present

## 2018-07-21 DIAGNOSIS — E782 Mixed hyperlipidemia: Secondary | ICD-10-CM | POA: Diagnosis not present

## 2018-07-21 DIAGNOSIS — E785 Hyperlipidemia, unspecified: Secondary | ICD-10-CM | POA: Diagnosis not present

## 2018-07-25 ENCOUNTER — Other Ambulatory Visit: Payer: Self-pay | Admitting: Internal Medicine

## 2018-07-25 DIAGNOSIS — E782 Mixed hyperlipidemia: Secondary | ICD-10-CM | POA: Diagnosis not present

## 2018-07-25 DIAGNOSIS — R945 Abnormal results of liver function studies: Secondary | ICD-10-CM | POA: Diagnosis not present

## 2018-07-25 DIAGNOSIS — L900 Lichen sclerosus et atrophicus: Secondary | ICD-10-CM | POA: Diagnosis not present

## 2018-07-25 DIAGNOSIS — E2839 Other primary ovarian failure: Secondary | ICD-10-CM

## 2018-07-25 DIAGNOSIS — N814 Uterovaginal prolapse, unspecified: Secondary | ICD-10-CM | POA: Diagnosis not present

## 2018-07-25 DIAGNOSIS — E1165 Type 2 diabetes mellitus with hyperglycemia: Secondary | ICD-10-CM | POA: Diagnosis not present

## 2018-07-25 DIAGNOSIS — F330 Major depressive disorder, recurrent, mild: Secondary | ICD-10-CM | POA: Diagnosis not present

## 2018-07-25 DIAGNOSIS — Z1231 Encounter for screening mammogram for malignant neoplasm of breast: Secondary | ICD-10-CM

## 2018-07-25 DIAGNOSIS — G4700 Insomnia, unspecified: Secondary | ICD-10-CM | POA: Diagnosis not present

## 2018-08-07 ENCOUNTER — Encounter: Payer: Self-pay | Admitting: *Deleted

## 2018-08-08 ENCOUNTER — Encounter: Payer: Medicare Other | Admitting: Obstetrics & Gynecology

## 2018-08-08 ENCOUNTER — Ambulatory Visit (INDEPENDENT_AMBULATORY_CARE_PROVIDER_SITE_OTHER): Payer: Medicare Other | Admitting: Obstetrics & Gynecology

## 2018-08-08 ENCOUNTER — Other Ambulatory Visit: Payer: Self-pay

## 2018-08-08 ENCOUNTER — Encounter: Payer: Self-pay | Admitting: Obstetrics & Gynecology

## 2018-08-08 VITALS — BP 116/75 | HR 84 | Ht 64.75 in | Wt 223.0 lb

## 2018-08-08 DIAGNOSIS — N816 Rectocele: Secondary | ICD-10-CM

## 2018-08-08 DIAGNOSIS — B373 Candidiasis of vulva and vagina: Secondary | ICD-10-CM

## 2018-08-08 DIAGNOSIS — E1165 Type 2 diabetes mellitus with hyperglycemia: Secondary | ICD-10-CM | POA: Diagnosis not present

## 2018-08-08 DIAGNOSIS — B3731 Acute candidiasis of vulva and vagina: Secondary | ICD-10-CM

## 2018-08-08 MED ORDER — FLUCONAZOLE 100 MG PO TABS: 100.0000 mg | ORAL_TABLET | Freq: Every day | ORAL | 0 refills | Status: DC

## 2018-08-08 NOTE — Progress Notes (Signed)
No chief complaint on file.     68 y.o. [id number] No LMP recorded. Patient has had a hysterectomy. The current method of family planning is status post hysterectomy and post menopausal status.  Outpatient Encounter Medications as of 08/08/2018  Medication Sig  . aspirin EC 81 MG tablet Take 81 mg by mouth daily.  . citalopram (CELEXA) 40 MG tablet Take 40 mg by mouth at bedtime.   . diphenhydrAMINE (BENADRYL) 25 MG tablet Take 12.5 mg by mouth 2 (two) times daily as needed for allergies.  . furosemide (LASIX) 20 MG tablet Take 10 mg by mouth daily.   . Insulin Degludec (TRESIBA) 100 UNIT/ML SOLN Inject 80 Units into the skin at bedtime.  . liraglutide (VICTOZA) 18 MG/3ML SOPN Inject 1.2 mg into the skin daily.  Marland Kitchen LORazepam (ATIVAN) 1 MG tablet Take 1 mg by mouth 2 (two) times daily as needed for anxiety (takes 1 tablet every night and 1 tablet in day only if needed for anxiety).   . metFORMIN (GLUCOPHAGE) 1000 MG tablet Take 2,000 mg by mouth daily.   Marland Kitchen omeprazole (PRILOSEC) 40 MG capsule Take 40 mg by mouth daily.  . fluconazole (DIFLUCAN) 100 MG tablet Take 1 tablet (100 mg total) by mouth daily.  . [DISCONTINUED] glipiZIDE (GLUCOTROL) 10 MG tablet Take 10 mg by mouth 2 (two) times daily before a meal. Takes 0.5 tablet  . [DISCONTINUED] naphazoline-glycerin (CLEAR EYES REDNESS) 0.012-0.2 % SOLN Place 1-2 drops into both eyes 4 (four) times daily as needed for eye irritation.  . [DISCONTINUED] oxyCODONE-acetaminophen (PERCOCET/ROXICET) 5-325 MG tablet Take 1 tablet by mouth every 4 (four) hours as needed.  . [DISCONTINUED] predniSONE (DELTASONE) 20 MG tablet 2 tabs po daily x 3 days   No facility-administered encounter medications on file as of 08/08/2018.     Subjective Referred from Pablo Lawrence, NP in Dr Juel Burrow office  Past Medical History:  Diagnosis Date  . Depression   . Diabetes mellitus without complication ([place])   . GERD (gastroesophageal reflux disease)   .  Hypercholesterolemia   . Osteoarthritis   . Sleep apnea    cannot tolerate CPAP    Past Surgical History:  Procedure Laterality Date  . BIOPSY  03/10/2017   Procedure: BIOPSY;  Surgeon: Daneil Dolin, MD;  [facility];  Service: Endoscopy;;  gastric  . ESOPHAGOGASTRODUODENOSCOPY (EGD) WITH PROPOFOL N/A 03/10/2017   Procedure: ESOPHAGOGASTRODUODENOSCOPY (EGD) WITH PROPOFOL;  Surgeon: Daneil Dolin, MD;  [facility];  Service: Endoscopy;  Laterality: N/A;  8:30AM  . HYSTERECTOMY ABDOMINAL WITH SALPINGECTOMY    . KNEE SURGERY Left   . MALONEY DILATION N/A 03/10/2017   Procedure: Venia Minks DILATION;  Surgeon: Daneil Dolin, MD;  [facility];  Service: Endoscopy;  Laterality: N/A;  . NASAL SEPTUM SURGERY    . TUBAL LIGATION      OB History    Gravida  4   Para  4   Term  4   Preterm      AB      Living  4     SAB      TAB      Ectopic      Multiple      Live Births              Allergies  Allergen Reactions  . Excedrin Extra Strength [Asa-Apap-Caff Buffered] Diarrhea and Nausea Only  . Statins Other (See Comments)  Hair Loss  . Wellbutrin [Bupropion] Hives    Social History   Socioeconomic History  . Marital status: Divorced    Spouse name: Not on file  . Number of children: Not on file  . Years of education: Not on file  . Highest education level: Not on file  Occupational History  . Occupation: [place]    [place]   Social Needs  . Financial resource strain: Not on file  . Food insecurity    Worry: Not on file    Inability: Not on file  . Transportation needs    Medical: Not on file    Non-medical: Not on file  Tobacco Use  . Smoking status: Former Smoker    Packs/day: 1.00    Years: 26.00    Pack years: 26.00    Types: Cigarettes    Quit date: 12/09/2004    Years since quitting: 13.6  . Smokeless tobacco: Never Used  Substance and Sexual Activity  . Alcohol use: No     Frequency: Never  . Drug use: No  . Sexual activity: Never    Birth control/protection: None  Lifestyle  . Physical activity    Days per week: Not on file    Minutes per session: Not on file  . Stress: Not on file  Relationships  . Social Musicianconnections    Talks on phone: Not on file    Gets together: Not on file    Attends religious service: Not on file    Active member of club or organization: Not on file    Attends meetings of clubs or organizations: Not on file    Relationship status: Not on file  Other Topics Concern  . Not on file  Social History Narrative  . Not on file    Family History  Problem Relation Age of Onset  . Heart failure Mother   . Heart disease Father   . Cancer Father        Lung  . Multiple sclerosis Daughter   . Diabetes Paternal Aunt   . Heart disease Maternal Grandmother   . Heart disease Maternal Grandfather   . Diabetes Paternal Grandmother   . Heart disease Paternal Grandmother   . Heart disease Paternal Grandfather   . Colon cancer Neg Hx   . Colon polyps Neg Hx     Medications:       Current Outpatient Medications:  .  aspirin EC 81 MG tablet, Take 81 mg by mouth daily., Disp: , Rfl:  .  citalopram (CELEXA) 40 MG tablet, Take 40 mg by mouth at bedtime. , Disp: , Rfl:  .  diphenhydrAMINE (BENADRYL) 25 MG tablet, Take 12.5 mg by mouth 2 (two) times daily as needed for allergies., Disp: , Rfl:  .  furosemide (LASIX) 20 MG tablet, Take 10 mg by mouth daily. , Disp: , Rfl:  .  Insulin Degludec (TRESIBA) 100 UNIT/ML SOLN, Inject 80 Units into the skin at bedtime., Disp: , Rfl:  .  liraglutide (VICTOZA) 18 MG/3ML SOPN, Inject 1.2 mg into the skin daily., Disp: , Rfl:  .  LORazepam (ATIVAN) 1 MG tablet, Take 1 mg by mouth 2 (two) times daily as needed for anxiety (takes 1 tablet every night and 1 tablet in day only if needed for anxiety). , Disp: , Rfl:  .  metFORMIN (GLUCOPHAGE) 1000 MG tablet, Take 2,000 mg by mouth daily. , Disp: , Rfl:  .   omeprazole (PRILOSEC) 40  MG capsule, Take 40 mg by mouth daily., Disp: , Rfl:  .  fluconazole (DIFLUCAN) 100 MG tablet, Take 1 tablet (100 mg total) by mouth daily., Disp: 14 tablet, Rfl: 0  Objective Blood pressure 116/75, pulse 84, height 5' 4.75" (1.645 m), weight 223 lb (101.2 kg).  General WDWN female NAD Vulva:  Erythema consistent with yeast +/- LSA, positive atrophic changes Vagina:  Yeast, foreshortened s/p bladder tack Cervix:  absent Uterus:  uterus absent Adnexa: ovaries:not present,     Pertinent ROS No burning with urination, frequency or urgency No nausea, vomiting or diarrhea Nor fever chills or other constitutional symptoms   Labs or studies     Impression Diagnoses this Encounter::   ICD-10-CM   1. Vulvovaginitis due to yeast  B37.3   2. Rectocele, female  N81.6   3. Poorly controlled type 2 diabetes mellitus (HCC)  E11.65     Established relevant diagnosis(es):   Plan/Recommendations: Meds ordered this encounter  Medications  . fluconazole (DIFLUCAN) 100 MG tablet    Sig: Take 1 tablet (100 mg total) by mouth daily.    Dispense:  14 tablet    Refill:  0    Labs or Scans Ordered: No orders of the defined types were placed in this encounter.   Management:: >painted with gentian violet >diflucn for 2 weeks >follow up for re eval for vaginal estrogen therapy vs topical steroids  Follow up Return in about 2 weeks (around 08/22/2018) for Follow up, with Dr Despina HiddenEure.      All questions were answered.

## 2018-08-22 ENCOUNTER — Other Ambulatory Visit: Payer: Self-pay

## 2018-08-22 ENCOUNTER — Encounter: Payer: Self-pay | Admitting: Obstetrics & Gynecology

## 2018-08-22 ENCOUNTER — Ambulatory Visit (INDEPENDENT_AMBULATORY_CARE_PROVIDER_SITE_OTHER): Payer: Medicare Other | Admitting: Obstetrics & Gynecology

## 2018-08-22 VITALS — BP 121/79 | HR 83 | Ht 64.75 in | Wt 218.0 lb

## 2018-08-22 DIAGNOSIS — N816 Rectocele: Secondary | ICD-10-CM

## 2018-08-22 DIAGNOSIS — B3731 Acute candidiasis of vulva and vagina: Secondary | ICD-10-CM

## 2018-08-22 DIAGNOSIS — B373 Candidiasis of vulva and vagina: Secondary | ICD-10-CM

## 2018-08-22 NOTE — Progress Notes (Signed)
Chief Complaint  Patient presents with  . Follow-up      68 y.o. [id number] No LMP recorded. Patient has had a hysterectomy. The current method of family planning is status post hysterectomy.  Outpatient Encounter Medications as of 08/22/2018  Medication Sig  . aspirin EC 81 MG tablet Take 81 mg by mouth daily.  . citalopram (CELEXA) 40 MG tablet Take 40 mg by mouth at bedtime.   . diphenhydrAMINE (BENADRYL) 25 MG tablet Take 12.5 mg by mouth 2 (two) times daily as needed for allergies.  . furosemide (LASIX) 20 MG tablet Take 10 mg by mouth daily.   . Insulin Degludec (TRESIBA) 100 UNIT/ML SOLN Inject 80 Units into the skin at bedtime.  . liraglutide (VICTOZA) 18 MG/3ML SOPN Inject 1.2 mg into the skin daily.  Marland Kitchen. LORazepam (ATIVAN) 1 MG tablet Take 1 mg by mouth 2 (two) times daily as needed for anxiety (takes 1 tablet every night and 1 tablet in day only if needed for anxiety).   . metFORMIN (GLUCOPHAGE) 1000 MG tablet Take 2,000 mg by mouth daily.   Marland Kitchen. omeprazole (PRILOSEC) 40 MG capsule Take 40 mg by mouth daily.  . fluconazole (DIFLUCAN) 100 MG tablet Take 1 tablet (100 mg total) by mouth daily. (Patient not taking: Reported on 08/22/2018)   No facility-administered encounter medications on file as of 08/22/2018.     Subjective Pt is much much better Symptoms essentially resolved Past Medical History:  Diagnosis Date  . Depression   . Diabetes mellitus without complication (HCC)   . GERD (gastroesophageal reflux disease)   . Hypercholesterolemia   . Osteoarthritis   . Sleep apnea    cannot tolerate CPAP    Past Surgical History:  Procedure Laterality Date  . BIOPSY  03/10/2017   Procedure: BIOPSY;  Surgeon: Corbin Adeourk, Robert M, MD;  [facility];  Service: Endoscopy;;  gastric  . ESOPHAGOGASTRODUODENOSCOPY (EGD) WITH PROPOFOL N/A 03/10/2017   Procedure: ESOPHAGOGASTRODUODENOSCOPY (EGD) WITH PROPOFOL;  Surgeon: Corbin Adeourk, Robert M, MD;  [facility];   Service: Endoscopy;  Laterality: N/A;  8:30AM  . HYSTERECTOMY ABDOMINAL WITH SALPINGECTOMY    . KNEE SURGERY Left   . MALONEY DILATION N/A 03/10/2017   Procedure: Elease HashimotoMALONEY DILATION;  Surgeon: Corbin Adeourk, Robert M, MD;  [facility];  Service: Endoscopy;  Laterality: N/A;  . NASAL SEPTUM SURGERY    . TUBAL LIGATION      OB History    Gravida  4   Para  4   Term  4   Preterm      AB      Living  4     SAB      TAB      Ectopic      Multiple      Live Births              Allergies  Allergen Reactions  . Excedrin Extra Strength [Asa-Apap-Caff Buffered] Diarrhea and Nausea Only  . Statins Other (See Comments)    Hair Loss  . Wellbutrin [Bupropion] Hives    Social History   Socioeconomic History  . Marital status: Divorced    Spouse name: Not on file  . Number of children: Not on file  . Years of education: Not on file  . Highest education level: Not on file  Occupational History  . Occupation: retired    [place]   Social Needs  . Financial  resource strain: Not on file  . Food insecurity    Worry: Not on file    Inability: Not on file  . Transportation needs    Medical: Not on file    Non-medical: Not on file  Tobacco Use  . Smoking status: Former Smoker    Packs/day: 1.00    Years: 26.00    Pack years: 26.00    Types: Cigarettes    Quit date: 12/09/2004    Years since quitting: 13.7  . Smokeless tobacco: Never Used  Substance and Sexual Activity  . Alcohol use: No    Frequency: Never  . Drug use: No  . Sexual activity: Never    Birth control/protection: None  Lifestyle  . Physical activity    Days per week: Not on file    Minutes per session: Not on file  . Stress: Not on file  Relationships  . Social Herbalist on phone: Not on file    Gets together: Not on file    Attends religious service: Not on file    Active member of club or organization: Not on file    Attends meetings of clubs or  organizations: Not on file    Relationship status: Not on file  Other Topics Concern  . Not on file  Social History Narrative  . Not on file    Family History  Problem Relation Age of Onset  . Heart failure Mother   . Heart disease Father   . Cancer Father        Lung  . Multiple sclerosis Daughter   . Diabetes Paternal Aunt   . Heart disease Maternal Grandmother   . Heart disease Maternal Grandfather   . Diabetes Paternal Grandmother   . Heart disease Paternal Grandmother   . Heart disease Paternal Grandfather   . Colon cancer Neg Hx   . Colon polyps Neg Hx     Medications:       Current Outpatient Medications:  .  aspirin EC 81 MG tablet, Take 81 mg by mouth daily., Disp: , Rfl:  .  citalopram (CELEXA) 40 MG tablet, Take 40 mg by mouth at bedtime. , Disp: , Rfl:  .  diphenhydrAMINE (BENADRYL) 25 MG tablet, Take 12.5 mg by mouth 2 (two) times daily as needed for allergies., Disp: , Rfl:  .  furosemide (LASIX) 20 MG tablet, Take 10 mg by mouth daily. , Disp: , Rfl:  .  Insulin Degludec (TRESIBA) 100 UNIT/ML SOLN, Inject 80 Units into the skin at bedtime., Disp: , Rfl:  .  liraglutide (VICTOZA) 18 MG/3ML SOPN, Inject 1.2 mg into the skin daily., Disp: , Rfl:  .  LORazepam (ATIVAN) 1 MG tablet, Take 1 mg by mouth 2 (two) times daily as needed for anxiety (takes 1 tablet every night and 1 tablet in day only if needed for anxiety). , Disp: , Rfl:  .  metFORMIN (GLUCOPHAGE) 1000 MG tablet, Take 2,000 mg by mouth daily. , Disp: , Rfl:  .  omeprazole (PRILOSEC) 40 MG capsule, Take 40 mg by mouth daily., Disp: , Rfl:  .  fluconazole (DIFLUCAN) 100 MG tablet, Take 1 tablet (100 mg total) by mouth daily. (Patient not taking: Reported on 08/22/2018), Disp: 14 tablet, Rfl: 0  Objective Blood pressure 121/79, pulse 83, height 5' 4.75" (1.645 m), weight 218 lb (98.9 kg).  General WDWN female NAD Vulva:  normal appearing vulva with no masses, tenderness or lesions Atrophic but not serious,  no  lichen sclerosus Vagina:  normal mucosa, no discharge Cervix:   no lesions Uterus:  absent Adnexa: negative   Pertinent ROS No burning with urination, frequency or urgency No nausea, vomiting or diarrhea Nor fever chills or other constitutional symptoms   Labs or studies     Impression Diagnoses this Encounter::   ICD-10-CM   1. Vulvovaginitis due to yeast  B37.3   2. Rectocele, female  N81.6     Established relevant diagnosis(es):   Plan/Recommendations: No orders of the defined types were placed in this encounter.   Labs or Scans Ordered: No orders of the defined types were placed in this encounter.   Management:: >pt is "99+% better" >pt instructed on GV and return if needed  Follow up Return if symptoms worsen or fail to improve.        All questions were answered.

## 2018-09-03 ENCOUNTER — Encounter: Payer: Self-pay | Admitting: Obstetrics & Gynecology

## 2018-09-04 ENCOUNTER — Ambulatory Visit (HOSPITAL_COMMUNITY)
Admission: RE | Admit: 2018-09-04 | Discharge: 2018-09-04 | Disposition: A | Discharge status: Home / Self Care | Payer: Medicare Other | Source: Ambulatory Visit | Attending: Internal Medicine | Admitting: Internal Medicine

## 2018-09-04 ENCOUNTER — Other Ambulatory Visit (HOSPITAL_COMMUNITY): Payer: Self-pay | Admitting: Internal Medicine

## 2018-09-04 ENCOUNTER — Other Ambulatory Visit: Payer: Self-pay

## 2018-09-04 ENCOUNTER — Encounter (HOSPITAL_COMMUNITY): Payer: Self-pay

## 2018-09-04 DIAGNOSIS — E2839 Other primary ovarian failure: Secondary | ICD-10-CM | POA: Diagnosis not present

## 2018-09-04 DIAGNOSIS — Z1231 Encounter for screening mammogram for malignant neoplasm of breast: Secondary | ICD-10-CM

## 2018-10-25 DIAGNOSIS — E782 Mixed hyperlipidemia: Secondary | ICD-10-CM | POA: Diagnosis not present

## 2018-10-25 DIAGNOSIS — F330 Major depressive disorder, recurrent, mild: Secondary | ICD-10-CM | POA: Diagnosis not present

## 2018-10-25 DIAGNOSIS — K219 Gastro-esophageal reflux disease without esophagitis: Secondary | ICD-10-CM | POA: Diagnosis not present

## 2018-10-25 DIAGNOSIS — E1165 Type 2 diabetes mellitus with hyperglycemia: Secondary | ICD-10-CM | POA: Diagnosis not present

## 2018-10-27 DIAGNOSIS — Z23 Encounter for immunization: Secondary | ICD-10-CM | POA: Diagnosis not present

## 2018-10-30 ENCOUNTER — Ambulatory Visit (INDEPENDENT_AMBULATORY_CARE_PROVIDER_SITE_OTHER): Payer: Medicare Other | Admitting: Orthopedic Surgery

## 2018-10-30 ENCOUNTER — Other Ambulatory Visit: Payer: Self-pay

## 2018-10-30 ENCOUNTER — Ambulatory Visit: Payer: Medicare Other

## 2018-10-30 VITALS — BP 123/70 | HR 87 | Temp 96.6°F | Ht 64.0 in | Wt 214.0 lb

## 2018-10-30 DIAGNOSIS — M25511 Pain in right shoulder: Secondary | ICD-10-CM

## 2018-10-30 DIAGNOSIS — G8929 Other chronic pain: Secondary | ICD-10-CM

## 2018-10-30 DIAGNOSIS — M47812 Spondylosis without myelopathy or radiculopathy, cervical region: Secondary | ICD-10-CM | POA: Diagnosis not present

## 2018-10-30 MED ORDER — DICLOFENAC SODIUM 1 % TD GEL: 4.0000 g | Freq: Four times a day (QID) | TRANSDERMAL | 5 refills | Status: DC

## 2018-10-30 NOTE — Patient Instructions (Signed)
4 g 4 x a day  Meds ordered this encounter  Medications  . diclofenac sodium (VOLTAREN) 1 % GEL    Sig: Apply 4 g topically 4 (four) times daily.    Dispense:  4 g    Refill:  5

## 2018-10-30 NOTE — Progress Notes (Signed)
Maria OvermanJudith L Williamson  10/30/2018  HISTORY SECTION :  Chief Complaint  Patient presents with  . Shoulder Pain    right shoulder pain    HPI The patient presents for evaluation of Maria ClosJudith was seen 2 years ago for fall relating and resulting in right shoulder pain she recovered after injection and physical therapy presents now with complaints of dull aching pain right shoulder right neck occasionally radiating into her right arm and when driving radiating into her right hand.  She notes no change in range of motion of the shoulder but a prominence over the right distal clavicle which she noticed about 3 months ago Overall her pain is mild Review of Systems  Constitutional: Negative for chills and fever.  Musculoskeletal: Positive for neck pain.  Skin: Negative.   Neurological: Positive for tingling.     has a past medical history of Depression, Diabetes mellitus without complication (HCC), GERD (gastroesophageal reflux disease), Hypercholesterolemia, Osteoarthritis, and Sleep apnea.   Past Surgical History:  Procedure Laterality Date  . BIOPSY  03/10/2017   Procedure: BIOPSY;  Surgeon: Corbin Adeourk, Robert M, MD;  [facility];  Service: Endoscopy;;  gastric  . BREAST BIOPSY     90's  . ESOPHAGOGASTRODUODENOSCOPY (EGD) WITH PROPOFOL N/A 03/10/2017   Procedure: ESOPHAGOGASTRODUODENOSCOPY (EGD) WITH PROPOFOL;  Surgeon: Corbin Adeourk, Robert M, MD;  [facility];  Service: Endoscopy;  Laterality: N/A;  8:30AM  . HYSTERECTOMY ABDOMINAL WITH SALPINGECTOMY    . KNEE SURGERY Left   . MALONEY DILATION N/A 03/10/2017   Procedure: Maria HashimotoMALONEY DILATION;  Surgeon: Corbin Adeourk, Robert M, MD;  [facility];  Service: Endoscopy;  Laterality: N/A;  . NASAL SEPTUM SURGERY    . TUBAL LIGATION      Body mass index is 36.73 kg/m.   Allergies  Allergen Reactions  . Excedrin Extra Strength [Asa-Apap-Caff Buffered] Diarrhea and Nausea Only  . Statins Other (See Comments)    Hair Loss  . Wellbutrin  [Bupropion] Hives     Current Outpatient Medications:  .  aspirin EC 81 MG tablet, Take 81 mg by mouth daily., Disp: , Rfl:  .  citalopram (CELEXA) 40 MG tablet, Take 40 mg by mouth at bedtime. , Disp: , Rfl:  .  diphenhydrAMINE (BENADRYL) 25 MG tablet, Take 12.5 mg by mouth 2 (two) times daily as needed for allergies., Disp: , Rfl:  .  furosemide (LASIX) 20 MG tablet, Take 10 mg by mouth daily. , Disp: , Rfl:  .  Insulin Degludec (TRESIBA) 100 UNIT/ML SOLN, Inject 80 Units into the skin at bedtime., Disp: , Rfl:  .  liraglutide (VICTOZA) 18 MG/3ML SOPN, Inject 1.2 mg into the skin daily., Disp: , Rfl:  .  LORazepam (ATIVAN) 1 MG tablet, Take 1 mg by mouth 2 (two) times daily as needed for anxiety (takes 1 tablet every night and 1 tablet in day only if needed for anxiety). , Disp: , Rfl:  .  metFORMIN (GLUCOPHAGE) 1000 MG tablet, Take 2,000 mg by mouth daily. , Disp: , Rfl:  .  omeprazole (PRILOSEC) 40 MG capsule, Take 40 mg by mouth daily., Disp: , Rfl:  .  diclofenac sodium (VOLTAREN) 1 % GEL, Apply 4 g topically 4 (four) times daily., Disp: 4 g, Rfl: 5 .  fluconazole (DIFLUCAN) 100 MG tablet, Take 1 tablet (100 mg total) by mouth daily. (Patient not taking: Reported on 08/22/2018), Disp: 14 tablet, Rfl: 0   PHYSICAL EXAM SECTION: 1) BP 123/70   Pulse 87  Temp (!) 96.6 F (35.9 C)   Ht 5\' 4"  (1.626 m)   Wt 214 lb (97.1 kg)   BMI 36.73 kg/m   Body mass index is 36.73 kg/m. General appearance: Well-developed well-nourished no gross deformities  2) Cardiovascular normal pulse and perfusion in the upper extremities normal color without edema  3) Neurologically deep tendon reflexes are equal and normal, no sensation loss or deficits no pathologic reflexes  4) Psychological: Awake alert and oriented x3 mood and affect normal  5) Skin no lacerations or ulcerations no nodularity no palpable masses, no erythema or nodularity  6) Musculoskeletal:  Cervical spine is nontender the right  trap is tender she has mild tenderness around the shoulder around the deltoid but she has full forward active motion with no weakness or instability normal muscle tone and skin  She has prominence of the distal clavicle with a little bit of tenderness at the [facility] joint pain when reaching across the chest is noted  MEDICAL DECISION SECTION:  Encounter Diagnoses  Name Primary?  . Chronic pain in right shoulder Yes  . Cervical spondylosis without myelopathy     Imaging X-ray shows narrowing of the [facility] joint with hypertrophy of the distal clavicle mild subacromial sclerosis and tuberosity sclerosis see report  Plan:  (Rx., Inj., surg., Frx, MRI/CT, XR:2) Patient seems to be having some neck symptoms her previous C-spine report I cannot see the film on the system but the report says she has cervical degenerative disc disease and this is contributing to some of her shoulder pain and the numbness when she is driving  Recommend NSAIDs but she has peptic ulcer disease so we went with topical Voltaren  She will follow-up with [place] on an as-needed basis   12:06 PM Arther Abbott, MD  10/30/2018

## 2018-11-08 ENCOUNTER — Ambulatory Visit: Payer: Medicare Other | Admitting: Family Medicine

## 2018-11-09 IMAGING — US US THYROID
1 series · 13 of 25 positions shown · non-contrast
Comparison: 08/25/2016

CLINICAL DATA: Thyroid nodule seen on carotid artery duplex.

EXAM:
THYROID ULTRASOUND
TECHNIQUE: Ultrasound examination of the thyroid gland and adjacent soft
tissues was performed.

[Series 1: us thyroid · 0.06mm/px · 13 of 62 slices shown]
[im 1/62]
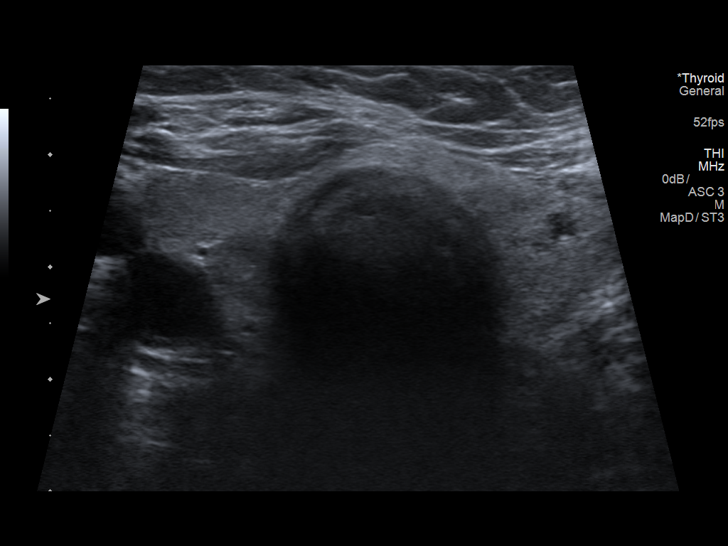
[im 6/62]
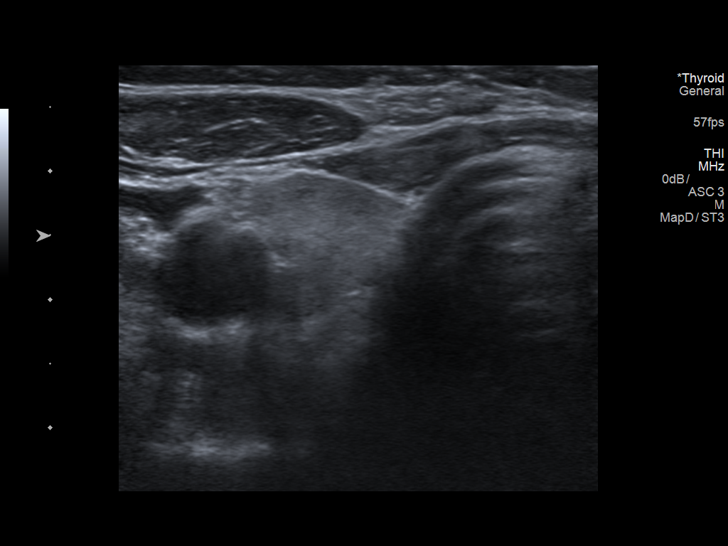
[im 11/62]
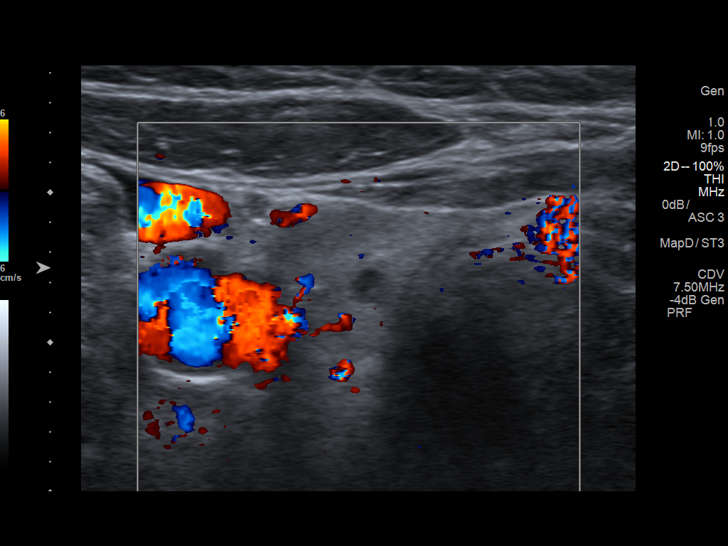
[im 16/62]
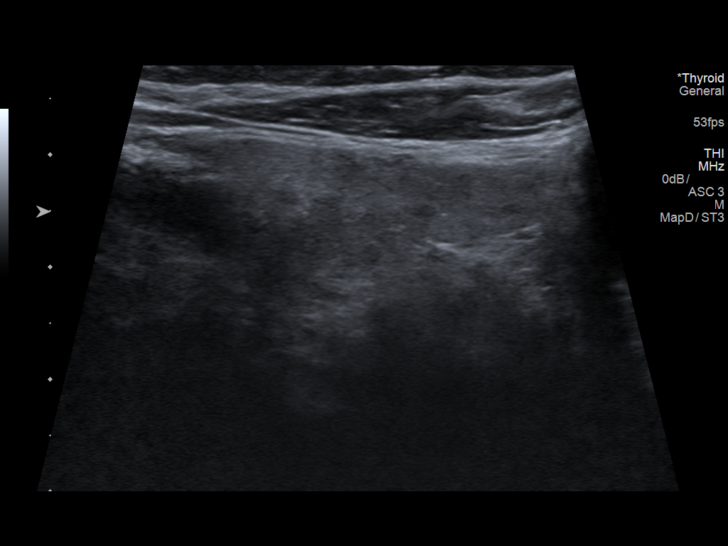
[im 21/62]
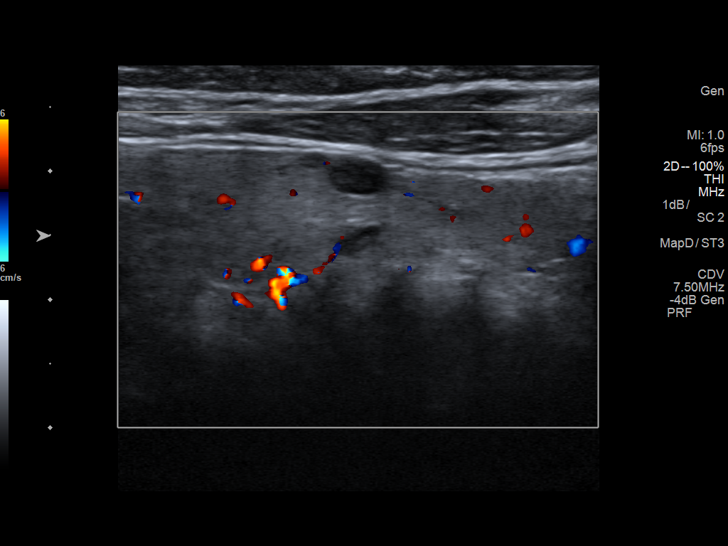
[im 26/62]
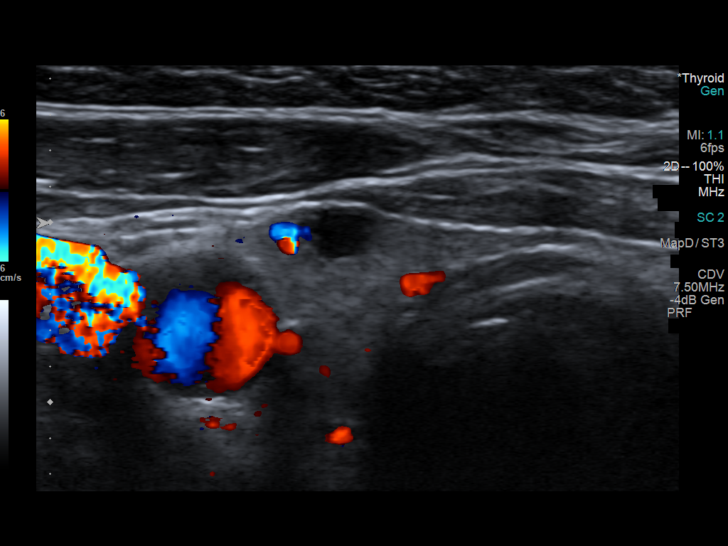
[im 31/62]
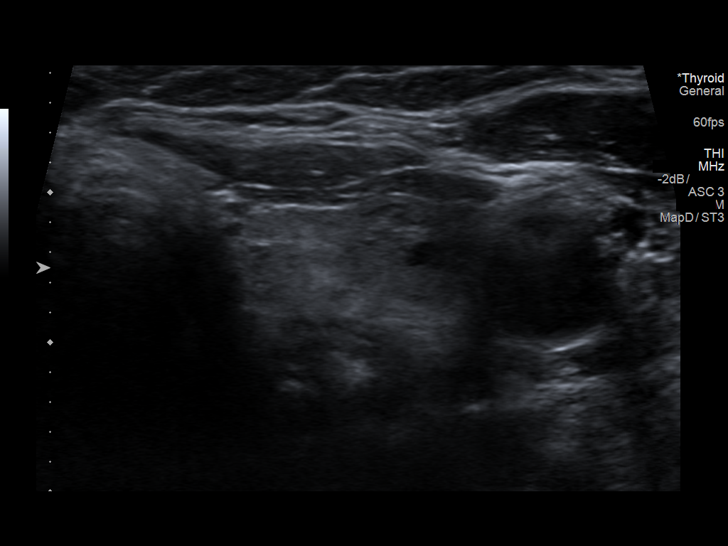
[im 36/62]
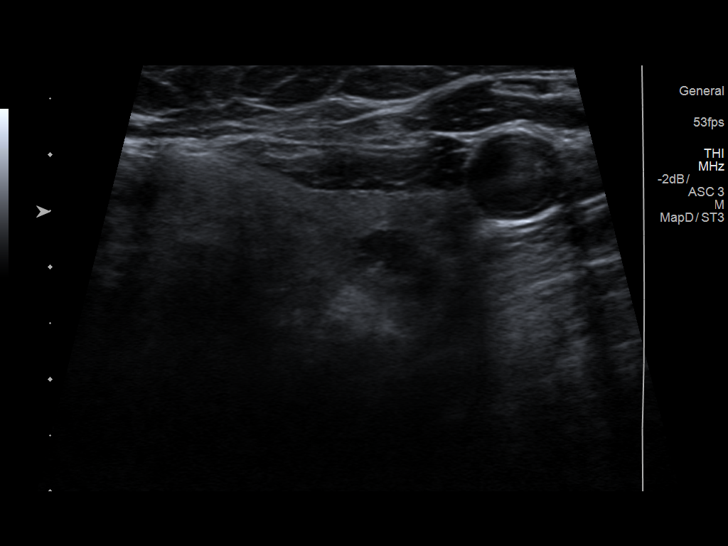
[im 41/62]
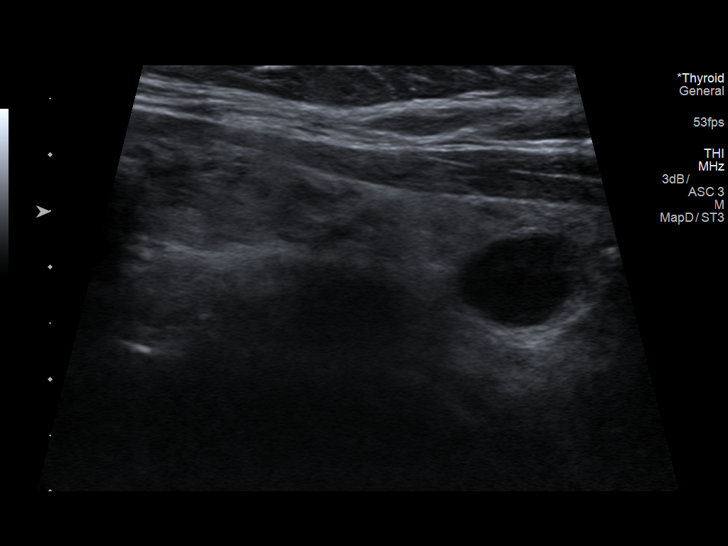
[im 46/62]
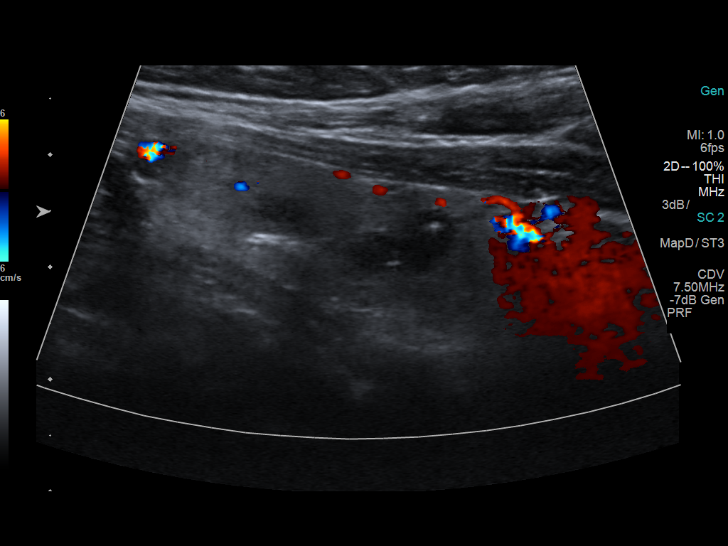
[im 51/62]
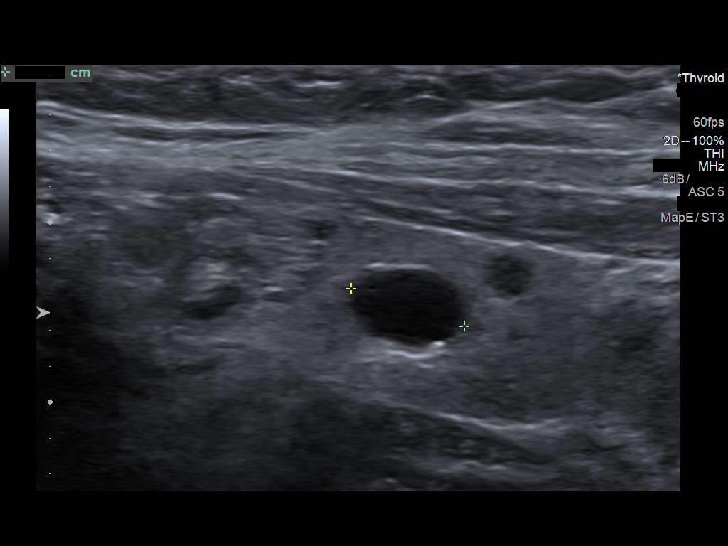
[im 56/62]
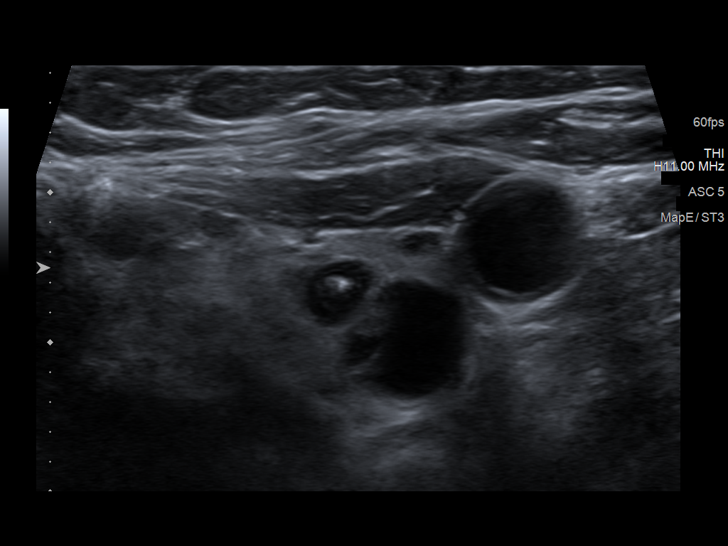
[im 62/62]
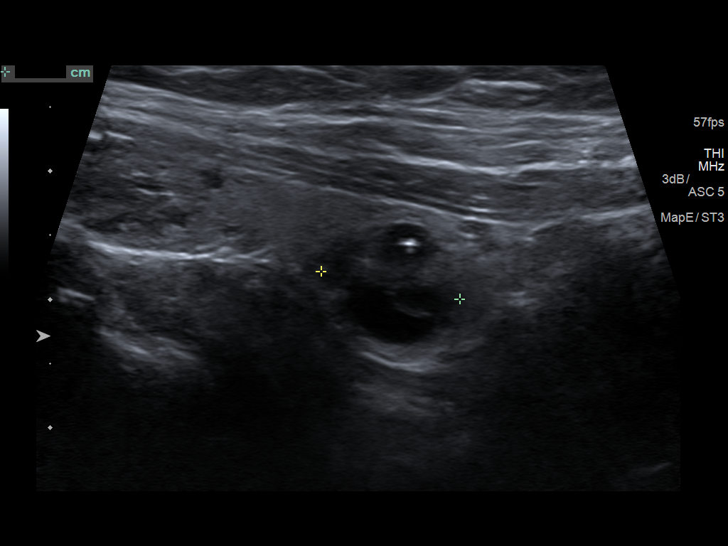

[13 of 25 positions shown; findings below may reference images not displayed]

FINDINGS: Parenchymal Echotexture: Moderately heterogenous

Isthmus: 0.2 cm.

Right lobe: 4.3 x 1.4 x 1.0 cm

Left lobe: 4.0 x 1.6 x 1.3 cm

_________________________________________________________

Estimated total number of nodules >/= 1 cm: 1

Number of spongiform nodules >/=  2 cm not described below (TR1): 0

Number of mixed cystic and solid nodules >/= 1.5 cm not described
below (TR2): 0

_________________________________________________________

Hypoechoic nodules and/or cysts in the right thyroid lobe, largest
measuring 0.6 cm. These do not meet criteria for biopsy or dedicated
follow-up.

Anechoic cyst in the mid left thyroid lobe measures 0.7 cm in
greatest dimension.

Nodule # 1:

Location: Left; Inferior

Maximum size: 1.1 cm; Other 2 dimensions: 1.1 x 1.1 cm

Composition: mixed cystic and solid (1)

Echogenicity: cannot determine (1)

Shape: not taller-than-wide (0)

Margins: lobulated/irregular (2)

Echogenic foci: large comet-tail artifacts (0)

ACR TI-RADS total points: 4.

ACR TI-RADS risk category: TR4 (4-6 points).

ACR TI-RADS recommendations:

*Given size (>/= 1 - 1.4 cm) and appearance, a follow-up ultrasound
in 1 year should be considered based on TI-RADS criteria.

________________________________________________________
IMPRESSION: Bilateral thyroid nodules and cysts.

Complex cystic nodule in the inferior left thyroid lobe meets
criteria for 1 year follow-up.

The above is in keeping with the ACR TI-RADS recommendations - [HOSPITAL] 7569;[DATE].

## 2018-11-28 DIAGNOSIS — E1165 Type 2 diabetes mellitus with hyperglycemia: Secondary | ICD-10-CM | POA: Diagnosis not present

## 2018-11-28 DIAGNOSIS — E785 Hyperlipidemia, unspecified: Secondary | ICD-10-CM | POA: Diagnosis not present

## 2018-11-28 DIAGNOSIS — E782 Mixed hyperlipidemia: Secondary | ICD-10-CM | POA: Diagnosis not present

## 2018-12-05 DIAGNOSIS — M545 Low back pain: Secondary | ICD-10-CM | POA: Diagnosis not present

## 2018-12-05 DIAGNOSIS — R945 Abnormal results of liver function studies: Secondary | ICD-10-CM | POA: Diagnosis not present

## 2018-12-05 DIAGNOSIS — M25511 Pain in right shoulder: Secondary | ICD-10-CM | POA: Diagnosis not present

## 2018-12-05 DIAGNOSIS — K219 Gastro-esophageal reflux disease without esophagitis: Secondary | ICD-10-CM | POA: Diagnosis not present

## 2018-12-05 DIAGNOSIS — E782 Mixed hyperlipidemia: Secondary | ICD-10-CM | POA: Diagnosis not present

## 2018-12-05 DIAGNOSIS — L900 Lichen sclerosus et atrophicus: Secondary | ICD-10-CM | POA: Diagnosis not present

## 2018-12-05 DIAGNOSIS — E669 Obesity, unspecified: Secondary | ICD-10-CM | POA: Diagnosis not present

## 2018-12-05 DIAGNOSIS — N816 Rectocele: Secondary | ICD-10-CM | POA: Diagnosis not present

## 2018-12-05 DIAGNOSIS — J302 Other seasonal allergic rhinitis: Secondary | ICD-10-CM | POA: Diagnosis not present

## 2018-12-05 DIAGNOSIS — E1165 Type 2 diabetes mellitus with hyperglycemia: Secondary | ICD-10-CM | POA: Diagnosis not present

## 2018-12-05 DIAGNOSIS — F330 Major depressive disorder, recurrent, mild: Secondary | ICD-10-CM | POA: Diagnosis not present

## 2018-12-05 DIAGNOSIS — G4700 Insomnia, unspecified: Secondary | ICD-10-CM | POA: Diagnosis not present

## 2018-12-07 DIAGNOSIS — K219 Gastro-esophageal reflux disease without esophagitis: Secondary | ICD-10-CM | POA: Diagnosis not present

## 2018-12-07 DIAGNOSIS — F330 Major depressive disorder, recurrent, mild: Secondary | ICD-10-CM | POA: Diagnosis not present

## 2018-12-07 DIAGNOSIS — E782 Mixed hyperlipidemia: Secondary | ICD-10-CM | POA: Diagnosis not present

## 2018-12-07 DIAGNOSIS — E1165 Type 2 diabetes mellitus with hyperglycemia: Secondary | ICD-10-CM | POA: Diagnosis not present

## 2018-12-27 DIAGNOSIS — H04123 Dry eye syndrome of bilateral lacrimal glands: Secondary | ICD-10-CM | POA: Diagnosis not present

## 2019-01-02 ENCOUNTER — Other Ambulatory Visit: Payer: Self-pay

## 2019-02-16 DIAGNOSIS — E1165 Type 2 diabetes mellitus with hyperglycemia: Secondary | ICD-10-CM | POA: Diagnosis not present

## 2019-02-16 DIAGNOSIS — K219 Gastro-esophageal reflux disease without esophagitis: Secondary | ICD-10-CM | POA: Diagnosis not present

## 2019-02-16 DIAGNOSIS — E7849 Other hyperlipidemia: Secondary | ICD-10-CM | POA: Diagnosis not present

## 2019-02-16 DIAGNOSIS — F330 Major depressive disorder, recurrent, mild: Secondary | ICD-10-CM | POA: Diagnosis not present

## 2019-03-22 DIAGNOSIS — Z23 Encounter for immunization: Secondary | ICD-10-CM | POA: Diagnosis not present

## 2019-04-13 DIAGNOSIS — E1165 Type 2 diabetes mellitus with hyperglycemia: Secondary | ICD-10-CM | POA: Diagnosis not present

## 2019-04-13 DIAGNOSIS — Z6834 Body mass index (BMI) 34.0-34.9, adult: Secondary | ICD-10-CM | POA: Diagnosis not present

## 2019-04-13 DIAGNOSIS — E785 Hyperlipidemia, unspecified: Secondary | ICD-10-CM | POA: Diagnosis not present

## 2019-04-13 DIAGNOSIS — E669 Obesity, unspecified: Secondary | ICD-10-CM | POA: Diagnosis not present

## 2019-04-13 DIAGNOSIS — G4700 Insomnia, unspecified: Secondary | ICD-10-CM | POA: Diagnosis not present

## 2019-04-13 DIAGNOSIS — E782 Mixed hyperlipidemia: Secondary | ICD-10-CM | POA: Diagnosis not present

## 2019-04-13 DIAGNOSIS — F330 Major depressive disorder, recurrent, mild: Secondary | ICD-10-CM | POA: Diagnosis not present

## 2019-04-13 DIAGNOSIS — J302 Other seasonal allergic rhinitis: Secondary | ICD-10-CM | POA: Diagnosis not present

## 2019-04-13 DIAGNOSIS — M545 Low back pain: Secondary | ICD-10-CM | POA: Diagnosis not present

## 2019-04-13 DIAGNOSIS — Z6835 Body mass index (BMI) 35.0-35.9, adult: Secondary | ICD-10-CM | POA: Diagnosis not present

## 2019-04-13 DIAGNOSIS — E7849 Other hyperlipidemia: Secondary | ICD-10-CM | POA: Diagnosis not present

## 2019-04-13 DIAGNOSIS — R945 Abnormal results of liver function studies: Secondary | ICD-10-CM | POA: Diagnosis not present

## 2019-04-17 DIAGNOSIS — E782 Mixed hyperlipidemia: Secondary | ICD-10-CM | POA: Diagnosis not present

## 2019-04-17 DIAGNOSIS — E7849 Other hyperlipidemia: Secondary | ICD-10-CM | POA: Diagnosis not present

## 2019-04-17 DIAGNOSIS — G4700 Insomnia, unspecified: Secondary | ICD-10-CM | POA: Diagnosis not present

## 2019-04-17 DIAGNOSIS — N816 Rectocele: Secondary | ICD-10-CM | POA: Diagnosis not present

## 2019-04-17 DIAGNOSIS — F330 Major depressive disorder, recurrent, mild: Secondary | ICD-10-CM | POA: Diagnosis not present

## 2019-04-17 DIAGNOSIS — R945 Abnormal results of liver function studies: Secondary | ICD-10-CM | POA: Diagnosis not present

## 2019-04-17 DIAGNOSIS — M545 Low back pain: Secondary | ICD-10-CM | POA: Diagnosis not present

## 2019-04-17 DIAGNOSIS — Z0001 Encounter for general adult medical examination with abnormal findings: Secondary | ICD-10-CM | POA: Diagnosis not present

## 2019-04-17 DIAGNOSIS — Z6832 Body mass index (BMI) 32.0-32.9, adult: Secondary | ICD-10-CM | POA: Diagnosis not present

## 2019-04-17 DIAGNOSIS — L900 Lichen sclerosus et atrophicus: Secondary | ICD-10-CM | POA: Diagnosis not present

## 2019-04-17 DIAGNOSIS — J302 Other seasonal allergic rhinitis: Secondary | ICD-10-CM | POA: Diagnosis not present

## 2019-04-17 DIAGNOSIS — E669 Obesity, unspecified: Secondary | ICD-10-CM | POA: Diagnosis not present

## 2019-04-17 DIAGNOSIS — E1165 Type 2 diabetes mellitus with hyperglycemia: Secondary | ICD-10-CM | POA: Diagnosis not present

## 2019-04-17 DIAGNOSIS — K219 Gastro-esophageal reflux disease without esophagitis: Secondary | ICD-10-CM | POA: Diagnosis not present

## 2019-04-20 DIAGNOSIS — Z23 Encounter for immunization: Secondary | ICD-10-CM | POA: Diagnosis not present

## 2019-05-21 DIAGNOSIS — F322 Major depressive disorder, single episode, severe without psychotic features: Secondary | ICD-10-CM | POA: Diagnosis not present

## 2019-05-21 DIAGNOSIS — F332 Major depressive disorder, recurrent severe without psychotic features: Secondary | ICD-10-CM | POA: Diagnosis not present

## 2019-05-21 DIAGNOSIS — F5081 Binge eating disorder: Secondary | ICD-10-CM | POA: Diagnosis not present

## 2019-05-21 DIAGNOSIS — E669 Obesity, unspecified: Secondary | ICD-10-CM | POA: Diagnosis not present

## 2019-06-27 DIAGNOSIS — F5081 Binge eating disorder: Secondary | ICD-10-CM | POA: Diagnosis not present

## 2019-06-27 DIAGNOSIS — E669 Obesity, unspecified: Secondary | ICD-10-CM | POA: Diagnosis not present

## 2019-06-27 DIAGNOSIS — F332 Major depressive disorder, recurrent severe without psychotic features: Secondary | ICD-10-CM | POA: Diagnosis not present

## 2019-07-20 ENCOUNTER — Ambulatory Visit (HOSPITAL_COMMUNITY)
Admission: RE | Admit: 2019-07-20 | Discharge: 2019-07-20 | Disposition: A | Discharge status: Home / Self Care | Payer: Medicare Other | Attending: Psychiatry | Admitting: Psychiatry

## 2019-07-20 DIAGNOSIS — R45851 Suicidal ideations: Secondary | ICD-10-CM | POA: Insufficient documentation

## 2019-07-20 DIAGNOSIS — Z1330 Encounter for screening examination for mental health and behavioral disorders, unspecified: Secondary | ICD-10-CM | POA: Insufficient documentation

## 2019-07-21 ENCOUNTER — Emergency Department (HOSPITAL_COMMUNITY)
Admission: EM | Admit: 2019-07-21 | Discharge: 2019-07-24 | Disposition: A | Discharge status: Other Acute Inpatient Hospital | Payer: Medicare Other | Attending: Emergency Medicine | Admitting: Emergency Medicine

## 2019-07-21 ENCOUNTER — Encounter (HOSPITAL_COMMUNITY): Payer: Self-pay

## 2019-07-21 DIAGNOSIS — Z79899 Other long term (current) drug therapy: Secondary | ICD-10-CM | POA: Insufficient documentation

## 2019-07-21 DIAGNOSIS — F329 Major depressive disorder, single episode, unspecified: Secondary | ICD-10-CM | POA: Diagnosis present

## 2019-07-21 DIAGNOSIS — Z87891 Personal history of nicotine dependence: Secondary | ICD-10-CM | POA: Insufficient documentation

## 2019-07-21 DIAGNOSIS — F332 Major depressive disorder, recurrent severe without psychotic features: Secondary | ICD-10-CM | POA: Diagnosis present

## 2019-07-21 DIAGNOSIS — Z20822 Contact with and (suspected) exposure to covid-19: Secondary | ICD-10-CM | POA: Diagnosis not present

## 2019-07-21 DIAGNOSIS — E119 Type 2 diabetes mellitus without complications: Secondary | ICD-10-CM | POA: Diagnosis not present

## 2019-07-21 DIAGNOSIS — F32A Depression, unspecified: Secondary | ICD-10-CM

## 2019-07-21 DIAGNOSIS — Z794 Long term (current) use of insulin: Secondary | ICD-10-CM | POA: Diagnosis not present

## 2019-07-21 DIAGNOSIS — Z7982 Long term (current) use of aspirin: Secondary | ICD-10-CM | POA: Insufficient documentation

## 2019-07-21 DIAGNOSIS — R059 Cough, unspecified: Secondary | ICD-10-CM

## 2019-07-21 DIAGNOSIS — F603 Borderline personality disorder: Secondary | ICD-10-CM | POA: Diagnosis present

## 2019-07-21 LAB — COMPREHENSIVE METABOLIC PANEL
ALT: 48 U/L — ABNORMAL HIGH (ref 0–44)
AST: 40 U/L (ref 15–41)
Albumin: 4.1 g/dL (ref 3.5–5.0)
Alkaline Phosphatase: 97 U/L (ref 38–126)
Anion gap: 10 (ref 5–15)
BUN: 14 mg/dL (ref 8–23)
CO2: 25 mmol/L (ref 22–32)
Calcium: 9.1 mg/dL (ref 8.9–10.3)
Chloride: 99 mmol/L (ref 98–111)
Creatinine, Ser: 0.49 mg/dL (ref 0.44–1.00)
GFR calc Af Amer: >60 mL/min (ref >60)
GFR calc non Af Amer: >60 mL/min (ref >60)
Glucose, Bld: 219 mg/dL — ABNORMAL HIGH (ref 70–99)
Potassium: 4.3 mmol/L (ref 3.5–5.1)
Sodium: 134 mmol/L — ABNORMAL LOW (ref 135–145)
Total Bilirubin: 0.5 mg/dL (ref 0.3–1.2)
Total Protein: 7.8 g/dL (ref 6.5–8.1)

## 2019-07-21 LAB — RAPID URINE DRUG SCREEN, HOSP PERFORMED
Amphetamines: NOT DETECTED
Barbiturates: NOT DETECTED
Benzodiazepines: POSITIVE — AB
Cocaine: NOT DETECTED
Opiates: NOT DETECTED
Tetrahydrocannabinol: NOT DETECTED

## 2019-07-21 LAB — SARS CORONAVIRUS 2 BY RT PCR (HOSPITAL ORDER, PERFORMED IN ~~LOC~~ HOSPITAL LAB): SARS Coronavirus 2: NEGATIVE

## 2019-07-21 LAB — CBC
HCT: 42.3 % (ref 36.0–46.0)
Hemoglobin: 13.4 g/dL (ref 12.0–15.0)
MCH: 24.7 pg — ABNORMAL LOW (ref 26.0–34.0)
MCHC: 31.7 g/dL (ref 30.0–36.0)
MCV: 77.9 fL — ABNORMAL LOW (ref 80.0–100.0)
Platelets: 318 10*3/uL (ref 150–400)
RBC: 5.43 MIL/uL — ABNORMAL HIGH (ref 3.87–5.11)
RDW: 16.8 % — ABNORMAL HIGH (ref 11.5–15.5)
WBC: 10.4 10*3/uL (ref 4.0–10.5)
nRBC: 0 % (ref 0.0–0.2)

## 2019-07-21 LAB — ACETAMINOPHEN LEVEL: Acetaminophen (Tylenol), Serum: <10 ug/mL — ABNORMAL LOW (ref 10–30)

## 2019-07-21 LAB — SALICYLATE LEVEL: Salicylate Lvl: <7 mg/dL — ABNORMAL LOW (ref 7.0–30.0)

## 2019-07-21 LAB — CBG MONITORING, ED
Glucose-Capillary: 166 mg/dL — ABNORMAL HIGH (ref 70–99)
Glucose-Capillary: 173 mg/dL — ABNORMAL HIGH (ref 70–99)

## 2019-07-21 LAB — ETHANOL: Alcohol, Ethyl (B): <10 mg/dL (ref <10)

## 2019-07-21 MED ORDER — PANTOPRAZOLE SODIUM 40 MG PO TBEC
40.0000 mg | DELAYED_RELEASE_TABLET | Freq: Every day | ORAL | Status: DC
  Administered 2019-07-21: 40 mg via ORAL
  Filled 2019-07-21 (×2): qty 1

## 2019-07-21 MED ORDER — LORAZEPAM 1 MG PO TABS
2.0000 mg | ORAL_TABLET | Freq: Every day | ORAL | Status: DC
  Administered 2019-07-21: 2 mg via ORAL
  Filled 2019-07-21: qty 2

## 2019-07-21 MED ORDER — LORAZEPAM 1 MG PO TABS
1.0000 mg | ORAL_TABLET | Freq: Once | ORAL | Status: AC
  Administered 2019-07-21: 1 mg via ORAL
  Filled 2019-07-21: qty 1

## 2019-07-21 MED ORDER — INSULIN DEGLUDEC 100 UNIT/ML ~~LOC~~ SOLN: 35.0000 [IU] | Freq: Two times a day (BID) | SUBCUTANEOUS | Status: DC

## 2019-07-21 MED ORDER — DOCUSATE SODIUM 100 MG PO CAPS
100.0000 mg | ORAL_CAPSULE | Freq: Every day | ORAL | Status: DC
  Administered 2019-07-21 – 2019-07-23 (×3): 100 mg via ORAL
  Filled 2019-07-21 (×4): qty 1

## 2019-07-21 MED ORDER — ACETAMINOPHEN 325 MG PO TABS
650.0000 mg | ORAL_TABLET | Freq: Once | ORAL | Status: AC
  Administered 2019-07-21: 650 mg via ORAL
  Filled 2019-07-21: qty 2

## 2019-07-21 MED ORDER — INSULIN GLARGINE 100 UNIT/ML ~~LOC~~ SOLN
35.0000 [IU] | Freq: Two times a day (BID) | SUBCUTANEOUS | Status: DC
  Administered 2019-07-21 – 2019-07-24 (×7): 35 [IU] via SUBCUTANEOUS
  Filled 2019-07-21 (×8): qty 0.35

## 2019-07-21 MED ORDER — NICOTINE 21 MG/24HR TD PT24
21.0000 mg | MEDICATED_PATCH | Freq: Every day | TRANSDERMAL | Status: DC
  Filled 2019-07-21 (×5): qty 1

## 2019-07-21 MED ORDER — PRAVASTATIN SODIUM 20 MG PO TABS
10.0000 mg | ORAL_TABLET | Freq: Every day | ORAL | Status: DC
  Administered 2019-07-21 – 2019-07-23 (×3): 10 mg via ORAL
  Filled 2019-07-21 (×3): qty 1

## 2019-07-21 MED ORDER — METFORMIN HCL ER 750 MG PO TB24
2000.0000 mg | ORAL_TABLET | Freq: Every day | ORAL | Status: DC
  Administered 2019-07-21 – 2019-07-24 (×4): 2000 mg via ORAL
  Filled 2019-07-21 (×5): qty 1

## 2019-07-21 MED ORDER — CITALOPRAM HYDROBROMIDE 10 MG PO TABS
40.0000 mg | ORAL_TABLET | Freq: Every day | ORAL | Status: DC
  Administered 2019-07-21 – 2019-07-23 (×4): 40 mg via ORAL
  Filled 2019-07-21 (×4): qty 4

## 2019-07-21 MED ORDER — FUROSEMIDE 20 MG PO TABS
10.0000 mg | ORAL_TABLET | Freq: Every day | ORAL | Status: DC
  Administered 2019-07-21 – 2019-07-24 (×4): 10 mg via ORAL
  Filled 2019-07-21 (×4): qty 0.5

## 2019-07-21 MED ORDER — ASPIRIN EC 81 MG PO TBEC
81.0000 mg | DELAYED_RELEASE_TABLET | Freq: Every day | ORAL | Status: DC
  Administered 2019-07-21 – 2019-07-24 (×4): 81 mg via ORAL
  Filled 2019-07-21 (×4): qty 1

## 2019-07-21 NOTE — ED Notes (Signed)
Pt alert x4, denies SI/HI states that she is ready to go home. Spoke with the pt's daughter [name] who would like to speak with the Psy provider in the moring. I will continue to monitor.

## 2019-07-21 NOTE — ED Notes (Signed)
Updated number for Daughter [name]) [phone]

## 2019-07-21 NOTE — ED Notes (Signed)
Pt's belongings are in one bag, and they have been moved to the cabinet labeled, "patient belongings [address]

## 2019-07-21 NOTE — H&P (Signed)
Behavioral Health Medical Screening Exam  Maria Williamson is an 69 y.o. female.  Total Time spent with patient: 30 minutes  Psychiatric Specialty Exam: Physical Exam  Constitutional: She is oriented to person, place, and time.  Non-toxic appearance. She does not appear ill. No distress.  HENT:  Head: Normocephalic.  Right Ear: External ear normal.  Left Ear: External ear normal.  Respiratory: Effort normal. No respiratory distress.  Musculoskeletal:        General: Tenderness and signs of injury present.  Neurological: She is alert and oriented to person, place, and time.  Skin: She is not diaphoretic.  Psychiatric: Her speech is normal. Her mood appears anxious. Thought content is not paranoid and not delusional. She exhibits a depressed mood. She expresses suicidal ideation. She expresses no homicidal ideation. She expresses suicidal plans.   Review of Systems  Constitutional: Negative for activity change, appetite change, chills, diaphoresis, fatigue, fever and unexpected weight change.  HENT: Negative for congestion.   Respiratory: Negative for cough and shortness of breath.   Cardiovascular: Negative for chest pain and palpitations.  Gastrointestinal: Negative for diarrhea, nausea and vomiting.  Musculoskeletal: Positive for arthralgias, back pain and gait problem.  Psychiatric/Behavioral: Positive for dysphoric mood, sleep disturbance and suicidal ideas. Negative for hallucinations. The patient is nervous/anxious. The patient is not hyperactive.   All other systems reviewed and are negative.  Blood pressure 137/79, pulse (!) 102, temperature 98.1 F (36.7 C), temperature source Oral, resp. rate 20, SpO2 96 %.There is no height or weight on file to calculate BMI. General Appearance: Casual and Fairly Groomed Eye Contact:  Good Speech:  Clear and Coherent and Normal Rate Volume:  Normal Mood:  Anxious, Dysphoric, Hopeless and Worthless Affect:  Congruent and Depressed Thought  Process:  Coherent, Goal Directed, Linear and Descriptions of Associations: Intact Orientation:  Full (Time, Place, and Person) Thought Content:  Logical Suicidal Thoughts:  Yes.  with intent/plan Homicidal Thoughts:  No Memory:  Immediate;   Fair Recent;   Fair Remote;   Fair Judgement:  Impaired Insight:  Lacking Psychomotor Activity:  Normal Concentration: Concentration: Fair and Attention Span: Fair Recall:  Good Fund of Knowledge:Good Language: Good Akathisia:  Negative Handed:  Right AIMS (if indicated):    Assets:  Communication Skills Desire for Improvement Financial Resources/Insurance Housing Leisure Time Physical Health Sleep:     Musculoskeletal: Strength & Muscle Tone: within normal limits Gait & Station: unsteady Patient leans: N/A  Blood pressure 137/79, pulse (!) 102, temperature 98.1 F (36.7 C), temperature source Oral, resp. rate 20, SpO2 96 %.  Recommendations: Based on my evaluation the patient does not appear to have an emergency medical condition.   Due to patient's medical history, unsteady gait, and recent knee injury due to falls she will need medical clearance prior to inpatient admission.  Jackelyn Poling, NP 07/21/2019, 12:40 AM

## 2019-07-21 NOTE — ED Provider Notes (Signed)
Emergency Medicine Observation Re-evaluation Note  Maria Williamson is a 69 y.o. female, seen on rounds today.  Pt initially presented to the ED for complaints of Suicidal Currently, the patient is suicidal  Physical Exam  BP 137/90   Pulse 85   Temp 97.9 F (36.6 C) (Oral)   Resp 14   Ht 1.626 m (5\' 4" )   Wt 99.8 kg   SpO2 95%   BMI 37.76 kg/m  Physical Exam  ED Course / MDM  EKG:    I have reviewed the labs performed to date as well as medications administered while in observation.  Recent changes in the last 24 hours include labs and first evaluation. Plan  Current plan is for psychiatric placement. Patient is not under full IVC at this time.   , MD 07/21/19 1024

## 2019-07-21 NOTE — ED Notes (Signed)
Pt alert this shift. Cooperative with care. Medication compliant. No s/s of distress.

## 2019-07-21 NOTE — Progress Notes (Signed)
Patient meets criteria for inpatient treatment. No appropriate or available beds at [facility]. CSW faxed referrals to the following facilities for review:  [facility]   [facility]   [facility]   [facility]   [facility]   [facility]   [facility]   [facility]     TTS will continue to seek bed placement.  Vilma Meckel. Algis Greenhouse, MSW, LCSW Clinical Social Work/Disposition Phone: [phone] Fax: [phone]

## 2019-07-21 NOTE — ED Notes (Signed)
Tele-psych machine placed in room. 

## 2019-07-21 NOTE — ED Notes (Signed)
Pt sts she cannot sleep and requesting Ativan.  Will notify provider.

## 2019-07-21 NOTE — ED Provider Notes (Signed)
[facility] DEPT Provider Note   CSN: [id number] Arrival date & time: 07/21/19  0033     History Chief Complaint  Patient presents with  . Suicidal    Maria Williamson is a 69 y.o. female.  The history is provided by the patient.  Mental Health Problem Presenting symptoms: depression and suicidal thoughts   Presenting symptoms: no aggressive behavior, no agitation, no bizarre behavior, no delusions, no disorganized speech, no disorganized thought process, no hallucinations, no homicidal ideas, no paranoid behavior, no self-mutilation, no suicidal threats and no suicide attempt   Patient accompanied by: NONE. Degree of incapacity (severity):  Moderate Onset quality:  Gradual Timing:  Constant Progression:  Worsening Chronicity:  Recurrent Context: alcohol use   Treatment compliance:  All of the time (celexa ) Relieved by:  Nothing Worsened by:  Nothing Ineffective treatments:  None tried Associated symptoms: feelings of worthlessness   Associated symptoms: no abdominal pain, no anhedonia, no anxiety, no appetite change, no chest pain, no decreased need for sleep, not distractible, no euphoric mood, no fatigue, no headaches, no hypersomnia, no hyperventilation, no insomnia, no irritability, no poor judgment, no school problems and no weight change   Risk factors: no family hx of mental illness        Past Medical History:  Diagnosis Date  . Depression   . Diabetes mellitus without complication ([place])   . GERD (gastroesophageal reflux disease)   . Hypercholesterolemia   . Osteoarthritis   . Sleep apnea    cannot tolerate CPAP    Patient Active Problem List   Diagnosis Date Noted  . GERD (gastroesophageal reflux disease) 06/06/2017  . Other dysphagia 01/26/2017  . Fatty liver 01/26/2017    Past Surgical History:  Procedure Laterality Date  . BIOPSY  03/10/2017   Procedure: BIOPSY;  Surgeon: Daneil Dolin, MD;  Location: [facility];   Service: Endoscopy;;  gastric  . BREAST BIOPSY     90's  . ESOPHAGOGASTRODUODENOSCOPY (EGD) WITH PROPOFOL N/A 03/10/2017   Procedure: ESOPHAGOGASTRODUODENOSCOPY (EGD) WITH PROPOFOL;  Surgeon: Daneil Dolin, MD;  Location: [facility];  Service: Endoscopy;  Laterality: N/A;  8:30AM  . HYSTERECTOMY ABDOMINAL WITH SALPINGECTOMY    . KNEE SURGERY Left   . MALONEY DILATION N/A 03/10/2017   Procedure: Venia Minks DILATION;  Surgeon: Daneil Dolin, MD;  Location: [facility];  Service: Endoscopy;  Laterality: N/A;  . NASAL SEPTUM SURGERY    . TUBAL LIGATION       OB History    Gravida  4   Para  4   Term  4   Preterm      AB      Living  4     SAB      TAB      Ectopic      Multiple      Live Births              Family History  Problem Relation Age of Onset  . Heart failure Mother   . Heart disease Father   . Cancer Father        Lung  . Multiple sclerosis Daughter   . Diabetes Paternal Aunt   . Heart disease Maternal Grandmother   . Heart disease Maternal Grandfather   . Diabetes Paternal Grandmother   . Heart disease Paternal Grandmother   . Heart disease Paternal Grandfather   . Colon cancer Neg Hx   . Colon polyps Neg  Hx     Social History   Tobacco Use  . Smoking status: Former Smoker    Packs/day: 1.00    Years: 26.00    Pack years: 26.00    Types: Cigarettes    Quit date: 12/09/2004    Years since quitting: 14.6  . Smokeless tobacco: Never Used  Vaping Use  . Vaping Use: Unknown  Substance Use Topics  . Alcohol use: No  . Drug use: No    Home Medications Prior to Admission medications   Medication Sig Start Date End Date Taking? Authorizing Provider  docusate sodium (COLACE) 100 MG capsule Take 100 mg by mouth daily.   Yes [provider]  furosemide (LASIX) 20 MG tablet Take 10 mg by mouth daily.    Yes [provider]  hydroxypropyl methylcellulose / hypromellose (ISOPTO TEARS / GONIOVISC) 2.5 % ophthalmic  solution Place 1 drop into both eyes 3 (three) times daily as needed for dry eyes.   Yes [provider]  ibuprofen (ADVIL) 200 MG tablet Take 800 mg by mouth every 6 (six) hours as needed for headache or moderate pain.   Yes [provider]  Insulin Degludec (TRESIBA) 100 UNIT/ML SOLN Inject 35 Units into the skin 2 (two) times daily.    Yes [provider]  levocetirizine (XYZAL) 5 MG tablet Take 5 mg by mouth in the morning.  05/05/19  Yes [provider]  liraglutide (VICTOZA) 18 MG/3ML SOPN Inject 1.2 mg into the skin daily.   Yes [provider]  LORazepam (ATIVAN) 1 MG tablet Take 2 mg by mouth at bedtime.    Yes [provider]  metFORMIN (GLUCOPHAGE-XR) 500 MG 24 hr tablet Take 2,000 mg by mouth daily. 06/05/19  Yes [provider]  methylphenidate (RITALIN) 10 MG tablet Take 10 mg by mouth daily.  06/28/19  Yes [provider]  Multiple Vitamin (MULTIVITAMIN WITH MINERALS) TABS tablet Take 1 tablet by mouth daily.   Yes [provider]  omeprazole (PRILOSEC) 40 MG capsule Take 40 mg by mouth daily.   Yes [provider]  pregabalin (LYRICA) 75 MG capsule Take 75 mg by mouth in the morning.  06/29/19  Yes [provider]  aspirin EC 81 MG tablet Take 81 mg by mouth daily.    [provider]  citalopram (CELEXA) 40 MG tablet Take 40 mg by mouth at bedtime.     [provider]  diclofenac sodium (VOLTAREN) 1 % GEL Apply 4 g topically 4 (four) times daily. Patient not taking: Reported on 07/21/2019 10/30/18   Vickki Hearing, MD  fluconazole (DIFLUCAN) 100 MG tablet Take 1 tablet (100 mg total) by mouth daily. Patient not taking: Reported on 08/22/2018 08/08/18   Lazaro Arms, MD  lovastatin (MEVACOR) 10 MG tablet Take 10 mg by mouth at bedtime. 05/05/19   [provider]    Allergies    Excedrin extra strength [asa-apap-caff buffered], Statins, and Wellbutrin  [bupropion]  Review of Systems   Review of Systems  Constitutional: Negative for appetite change, fatigue and irritability.  HENT: Negative for congestion.   Eyes: Negative for photophobia.  Respiratory: Negative for shortness of breath.   Cardiovascular: Negative for chest pain.  Gastrointestinal: Negative for abdominal pain.  Genitourinary: Negative for difficulty urinating.  Musculoskeletal: Negative for arthralgias.  Neurological: Negative for headaches.  Psychiatric/Behavioral: Positive for suicidal ideas. Negative for agitation, hallucinations, homicidal ideas, paranoia and self-injury. The patient is not nervous/anxious and does not  have insomnia.   All other systems reviewed and are negative.   Physical Exam Updated Vital Signs BP (!) 164/87 (BP Location: Left Arm)   Pulse 90   Temp 98.5 F (36.9 C) (Oral)   Resp 18   Ht 5\' 4"  (1.626 m)   Wt 99.8 kg   SpO2 93%   BMI 37.76 kg/m   Physical Exam Vitals and nursing note reviewed.  Constitutional:      General: She is not in acute distress.    Appearance: Normal appearance.  HENT:     Head: Normocephalic.     Nose: Nose normal.  Eyes:     Conjunctiva/sclera: Conjunctivae normal.     Pupils: Pupils are equal, round, and reactive to light.  Cardiovascular:     Rate and Rhythm: Normal rate and regular rhythm.     Pulses: Normal pulses.     Heart sounds: Normal heart sounds.  Pulmonary:     Effort: Pulmonary effort is normal.     Breath sounds: Normal breath sounds.  Abdominal:     General: Abdomen is flat. Bowel sounds are normal.     Tenderness: There is no abdominal tenderness. There is no guarding.  Musculoskeletal:        General: Normal range of motion.     Cervical back: Normal range of motion and neck supple.  Skin:    General: Skin is warm and dry.     Capillary Refill: Capillary refill takes less than 2 seconds.  Neurological:     General: No focal deficit present.     Mental Status: She is alert  and oriented to person, place, and time.     Deep Tendon Reflexes: Reflexes normal.  Psychiatric:        Mood and Affect: Mood normal.        Behavior: Behavior normal.     ED Results / Procedures / Treatments   Labs (all labs ordered are listed, but only abnormal results are displayed) Results for orders placed or performed during the hospital encounter of 07/21/19  Comprehensive metabolic panel  Result Value Ref Range   Sodium 134 (L) 135 - 145 mmol/L   Potassium 4.3 3.5 - 5.1 mmol/L   Chloride 99 98 - 111 mmol/L   CO2 25 22 - 32 mmol/L   Glucose, Bld 219 (H) 70 - 99 mg/dL   BUN 14 8 - 23 mg/dL   Creatinine, Ser 09/20/19 0.44 - 1.00 mg/dL   Calcium 9.1 8.9 - 4.65 mg/dL   Total Protein 7.8 6.5 - 8.1 g/dL   Albumin 4.1 3.5 - 5.0 g/dL   AST 40 15 - 41 U/L   ALT 48 (H) 0 - 44 U/L   Alkaline Phosphatase 97 38 - 126 U/L   Total Bilirubin 0.5 0.3 - 1.2 mg/dL   GFR calc non Af Amer >60 >60 mL/min   GFR calc Af Amer >60 >60 mL/min   Anion gap 10 5 - 15  Ethanol  Result Value Ref Range   Alcohol, Ethyl (B) <10 <10 mg/dL  Salicylate level  Result Value Ref Range   Salicylate Lvl <7.0 (L) 7.0 - 30.0 mg/dL  Acetaminophen level  Result Value Ref Range   Acetaminophen (Tylenol), Serum <10 (L) 10 - 30 ug/mL  cbc  Result Value Ref Range   WBC 10.4 4.0 - 10.5 K/uL   RBC 5.43 (H) 3.87 - 5.11 MIL/uL   Hemoglobin 13.4 12.0 - 15.0 g/dL   HCT 68.1 36 -  46 %   MCV 77.9 (L) 80.0 - 100.0 fL   MCH 24.7 (L) 26.0 - 34.0 pg   MCHC 31.7 30.0 - 36.0 g/dL   RDW 71.6 (H) 96.7 - 89.3 %   Platelets 318 150 - 400 K/uL   nRBC 0.0 0.0 - 0.2 %  Rapid urine drug screen (hospital performed)  Result Value Ref Range   Opiates NONE DETECTED NONE DETECTED   Cocaine NONE DETECTED NONE DETECTED   Benzodiazepines POSITIVE (A) NONE DETECTED   Amphetamines NONE DETECTED NONE DETECTED   Tetrahydrocannabinol NONE DETECTED NONE DETECTED   Barbiturates NONE DETECTED NONE DETECTED   No results  found.  EKG None  Radiology No results found.  Procedures Procedures (including critical care time)  Medications Ordered in ED Medications - No data to display  ED Course  I have reviewed the triage vital signs and the nursing notes.  Pertinent labs & imaging results that were available during my care of the patient were reviewed by me and considered in my medical decision making (see chart for details).    Medically cleared for me by psychiatry.    The patient has been placed in psychiatric observation due to the need to provide a safe environment for the patient while obtaining psychiatric consultation and evaluation, as well as ongoing medical and medication management to treat the patient's condition.  The patient has not been placed under full IVC at this time.  Final Clinical Impression(s) / ED Diagnoses Holding orders placed in [facility]   Everlynn Sagun, MD 07/21/19 0230

## 2019-07-21 NOTE — [facility] Counselor (Addendum)
Pt was assessed as a walk-in at [facility], another assessment is not needed at this time.  Disposition: Nira Conn, NP recommends pt meets inpatient treatment. Pt to go to [facility] for medical clearance. TTS to seek placement.  Redmond Pulling, MS, [facility], [facility] Triage Specialist [phone]

## 2019-07-21 NOTE — ED Triage Notes (Signed)
Pt reports a hx of depression and states that tonight she had a breakdown and started feeling suicidal. No particular plan. A&Ox4. Ambulatory with cane.

## 2019-07-21 NOTE — BH Assessment (Addendum)
Assessment Note  Maria Williamson is an 69 y.o. female, who presents voluntary and accompanied by her daughter and son Arrie Aran and Merrily Pew) to [place]. Clinician completed pt's assessment alone. Clinician asked the pt, "what brought you to the hospital?" Pt reported, having a hard time, things happening in her family, her feelings are deeply hurt. Pt reported, she feels everything she say is wrong. Pt reported, her relationship with her daughter has changed. Pt reported, she has thought about buying a camper, generator, living in her car; or moving back to [place] (would be complicated). Pt reported, her great-grandson feel she hugs him too much, his parents has backed away from her. Pt reported, at two separate gatherings she was accused of being a racist.  Pt reported, a teenager at one of the gatherings only heard the end of a comment and accused he of being racist, which what she denies. Per pt, her grandsons parents apologized for her without knowing what was said or the context. Pt reported, she does not want to be here. Pt reported, last night she did research online to find the quickest way to go. Pt reported, she found household chemicals that can be used,  a bag with a drawstring, a hose and helium. Pt denies, SI, HI, AVH, self-injurious behaviors.   Pt denies, substance use. Pt denies, being linked to OPT resources (medication management and/or counseling.) Pt has a previous inpatient admission in 1990's at a facility [place].   Pt presents tearful at times, quiet, awake with logical, coherent speech. Pt's eye contact was good. Pt's mood, affect was depressed, sad. Pt's thought process was coherent, relevant. Pt's judgement was partial. Pt was oriented x4.Pt's concentration was normal. Pt's insight and impulse control was fair. Pt reported, if discharged from [facility] she could contract for safety.   Diagnosis: Major Depressive Disorder, recurrent, severe without psychotic features.   Past  Medical History:  Past Medical History:  Diagnosis Date  . Depression   . Diabetes mellitus without complication ([place])   . GERD (gastroesophageal reflux disease)   . Hypercholesterolemia   . Osteoarthritis   . Sleep apnea    cannot tolerate CPAP    Past Surgical History:  Procedure Laterality Date  . BIOPSY  03/10/2017   Procedure: BIOPSY;  Surgeon: Daneil Dolin, MD;  [facility];  Service: Endoscopy;;  gastric  . BREAST BIOPSY     90's  . ESOPHAGOGASTRODUODENOSCOPY (EGD) WITH PROPOFOL N/A 03/10/2017   Procedure: ESOPHAGOGASTRODUODENOSCOPY (EGD) WITH PROPOFOL;  Surgeon: Daneil Dolin, MD;  [facility];  Service: Endoscopy;  Laterality: N/A;  8:30AM  . HYSTERECTOMY ABDOMINAL WITH SALPINGECTOMY    . KNEE SURGERY Left   . MALONEY DILATION N/A 03/10/2017   Procedure: Venia Minks DILATION;  Surgeon: Daneil Dolin, MD;  [facility];  Service: Endoscopy;  Laterality: N/A;  . NASAL SEPTUM SURGERY    . TUBAL LIGATION      Family History:  Family History  Problem Relation Age of Onset  . Heart failure Mother   . Heart disease Father   . Cancer Father        Lung  . Multiple sclerosis Daughter   . Diabetes Paternal Aunt   . Heart disease Maternal Grandmother   . Heart disease Maternal Grandfather   . Diabetes Paternal Grandmother   . Heart disease Paternal Grandmother   . Heart disease Paternal Grandfather   . Colon cancer Neg Hx   . Colon polyps  Neg Hx     Social History:  reports that she quit smoking about 14 years ago. Her smoking use included cigarettes. She has a 26.00 pack-year smoking history. She has never used smokeless tobacco. She reports that she does not drink alcohol and does not use drugs.  Additional Social History:  Alcohol / Drug Use Pain Medications: See MAR Prescriptions: See MAR Over the Counter: See MAR History of alcohol / drug use?: No history of alcohol / drug abuse  CIWA: CIWA-Ar BP: 137/79 Pulse Rate: (!) 102  (RN Notified) COWS:    Allergies:  Allergies  Allergen Reactions  . Excedrin Extra Strength [Asa-Apap-Caff Buffered] Diarrhea and Nausea Only  . Statins Other (See Comments)    Hair Loss  . Wellbutrin [Bupropion] Hives    Home Medications: (Not in a hospital admission)   OB/GYN Status:  No LMP recorded. Patient has had a hysterectomy.  General Assessment Data Location of Assessment: [facility] Assessment Services TTS Assessment: In system Is this a Tele or Face-to-Face Assessment?: Face-to-Face Is this an Initial Assessment or a Re-assessment for this encounter?: Initial Assessment Patient Accompanied by:: Adult (Daughther and son.) Permission Given to speak with another: Yes Name, Relationship and Phone Number: Sandria Senter, daugther, [phone]. Gene Colee, son, [phone]. (Per chart). ) Language Other than English: No Living Arrangements: Other (Comment) (Daughter and son-in-law. ) Marital status: Divorced Living Arrangements: Children, Other relatives Can pt return to current living arrangement?: Yes Admission Status: Voluntary Is patient capable of signing voluntary admission?: Yes Referral Source: Self/Family/Friend Insurance type: Medicare.   Medical Screening Exam [facility] Walk-in ONLY) Medical Exam completed: Yes  Crisis Care Plan Living Arrangements: Children, Other relatives Legal Guardian: Other: (Self. ) Name of Psychiatrist: None.  Name of Therapist: None  Education Status Is patient currently in school?: No Is the patient employed, unemployed or receiving disability?:  (Retired. )  Risk to self with the past 6 months Suicidal Ideation: Yes-Currently Present Has patient been a risk to self within the past 6 months prior to admission? : Yes Suicidal Intent: Yes-Currently Present Has patient had any suicidal intent within the past 6 months prior to admission? : Yes Is patient at risk for suicide?: Yes Suicidal Plan?: No (Pt denies. ) Has patient had  any suicidal plan within the past 6 months prior to admission? : No Access to Means: No (Pt denies. ) What has been your use of drugs/alcohol within the last 12 months?: Pt denies. Previous Attempts/Gestures: No (Pt denies. ) How many times?: 0 Other Self Harm Risks: SI, depression.  Triggers for Past Attempts: None known Intentional Self Injurious Behavior: None (Pt denies. ) Recent stressful life event(s): Other (Comment) (Interactions with family. ) Persecutory voices/beliefs?: No Depression: Yes Depression Symptoms: Feeling worthless/self pity, Guilt, Fatigue, Isolating, Tearfulness, Despondent Substance abuse history and/or treatment for substance abuse?: No Suicide prevention information given to non-admitted patients: Not applicable  Risk to Others within the past 6 months Homicidal Ideation: No (Pt denies.) Does patient have any lifetime risk of violence toward others beyond the six months prior to admission? : No Thoughts of Harm to Others: No Current Homicidal Intent: No Current Homicidal Plan: No Access to Homicidal Means: No Identified Victim: NA History of harm to others?: No Assessment of Violence: None Noted Violent Behavior Description: NA Does patient have access to weapons?: No Criminal Charges Pending?: No Does patient have a court date: No Is patient on probation?: No  Psychosis Hallucinations: None noted (Pt denies.) Delusions: None noted (  Pt denies.)  Mental Status Report Appearance/Hygiene: Unremarkable Eye Contact: Good Motor Activity: Unremarkable Speech: Logical/coherent Level of Consciousness: Quiet/awake (Tearful at times. ) Mood: Depressed, Sad Affect: Depressed, Sad Anxiety Level: Minimal Thought Processes: Coherent, Relevant Judgement: Partial Orientation: Person, Place, Time, Situation Obsessive Compulsive Thoughts/Behaviors: None  Cognitive Functioning Concentration: Normal Memory: Recent Intact Is patient IDD: No Insight:  Fair Impulse Control: Fair Appetite: Good Sleep: No Change Total Hours of Sleep: 10 Vegetative Symptoms: None  ADLScreening [facility] Assessment Services) Patient's cognitive ability adequate to safely complete daily activities?: Yes Patient able to express need for assistance with ADLs?: Yes Independently performs ADLs?: Yes (appropriate for developmental age)  Prior Inpatient Therapy Prior Inpatient Therapy: Yes Prior Therapy Dates: Per pt, in the 1990's. Prior Therapy Facilty/Provider(s): A facility in [place]. Reason for Treatment: Depression.   Prior Outpatient Therapy Prior Outpatient Therapy: No Does patient have an ACCT team?: No Does patient have Intensive In-House Services?  : No Does patient have [facility] services? : No Does patient have P4CC services?: No  ADL Screening (condition at time of admission) Patient's cognitive ability adequate to safely complete daily activities?: Yes Is the patient deaf or have difficulty hearing?: No Does the patient have difficulty seeing, even when wearing glasses/contacts?: No Does the patient have difficulty concentrating, remembering, or making decisions?: No Patient able to express need for assistance with ADLs?: Yes Does the patient have difficulty dressing or bathing?: No Independently performs ADLs?: Yes (appropriate for developmental age) Does the patient have difficulty walking or climbing stairs?: No Weakness of Legs: None (Pt reported, her left knee acts funny.) Weakness of Arms/Hands: None  Home Assistive Devices/Equipment Home Assistive Devices/Equipment: Cane (specify quad or straight)    Abuse/Neglect Assessment (Assessment to be complete while patient is alone) Abuse/Neglect Assessment Can Be Completed: Yes Physical Abuse: Denies Verbal Abuse: Yes, past (Comment) (Emotional abuse.) Sexual Abuse: Denies Exploitation of patient/patient's resources: Denies Self-Neglect: Denies     Merchant [employer] officer (For  Healthcare) Does Patient Have a Medical Advance Directive?: No          Disposition: Nira Conn, NP recommends pt meets inpatient treatment. Pt to go to [facility] for medical clearance. TTS to seek placement.   Disposition Initial Assessment Completed for this Encounter: Yes  On Site Evaluation by: Redmond Pulling, MS, [facility], CRC.  Reviewed with Physician: Nira Conn, NP.  Redmond Pulling 07/21/2019 1:07 AM    Redmond Pulling, MS, [facility], CRC Triage Specialist [phone]

## 2019-07-22 DIAGNOSIS — F603 Borderline personality disorder: Secondary | ICD-10-CM | POA: Diagnosis present

## 2019-07-22 DIAGNOSIS — F332 Major depressive disorder, recurrent severe without psychotic features: Secondary | ICD-10-CM | POA: Diagnosis not present

## 2019-07-22 LAB — CBG MONITORING, ED
Glucose-Capillary: 160 mg/dL — ABNORMAL HIGH (ref 70–99)
Glucose-Capillary: 211 mg/dL — ABNORMAL HIGH (ref 70–99)

## 2019-07-22 MED ORDER — DIVALPROEX SODIUM 125 MG PO CSDR
250.0000 mg | DELAYED_RELEASE_CAPSULE | Freq: Two times a day (BID) | ORAL | Status: DC
  Administered 2019-07-22 – 2019-07-24 (×5): 250 mg via ORAL
  Filled 2019-07-22 (×5): qty 2

## 2019-07-22 MED ORDER — LORAZEPAM 1 MG PO TABS
1.0000 mg | ORAL_TABLET | Freq: Every day | ORAL | Status: DC
  Administered 2019-07-22: 1 mg via ORAL
  Filled 2019-07-22: qty 1

## 2019-07-22 NOTE — Progress Notes (Signed)
Pt continues to meet criteria for inpatient gero-psych placement per Molli Knock FNP. CSW faxed referral to the following facilities:  [facility]  TTS is continuing to seek placement.    Mckinze Poirier S. Alan Ripper, MSW, LCSW Clinical Social Worker 07/22/2019 12:00 PM

## 2019-07-22 NOTE — Consult Note (Addendum)
[facility] Face-to-Face Psychiatry Consult   Reason for Consult:  Suicide threats Referring Physician:  EDP Patient Identification: Maria Williamson MRN:  [id number] Principal Diagnosis: Major depressive disorder, recurrent severe without psychotic features (HCC) Diagnosis:  Principal Problem:   Major depressive disorder, recurrent severe without psychotic features (HCC) Active Problems:   Borderline personality disorder (HCC)   Total Time spent with patient: 1 hour  Subjective:   Maria Williamson is a 69 y.o. female patient admitted with suicidal threats.  Patient seen and evaluated in person by this provider.  She reports that she was upset regarding family discord and became very emotional.  Then she started researching ways to die when she thought that her daughter and son-in-law were out of the house.  Evidently they were not out of the house and she confessed to them what she was doing.  They brought her to the emergency department concern for her safety.  Now the patient minimizes her actions.  She did give this provider permission to speak with her daughter Maria Williamson.  Collateral information from her daughter, Maria Williamson: She reports her mother has a long history of mental illness with frequent "nervous breakdowns" and admissions to psychiatric hospitals.  Diagnosis is of depression and borderline personality.  She has threatened to harm herself in the past and does not carry through as she does not want to be in pain.  Now they discovered that she found a way online to kill herself that is pain-free.  She and her siblings are very concerned that her mother will carry through with this and the patient does admit that she is saving this way to die for some point in her life.  Her family has tried multiple times to get her to go to therapy and psychiatric appointments to no avail.  They do not feel she is safe to return home, psychiatric admission required.  HPI per TTS:  Maria Williamson is an 69 y.o. female,  who presents voluntary and accompanied by her daughter and son Maria Williamson and Maria Williamson) to [facility]. Clinician completed pt's assessment alone. Clinician asked the pt, "what brought you to the hospital?" Pt reported, having a hard time, things happening in her family, her feelings are deeply hurt. Pt reported, she feels everything she say is wrong. Pt reported, her relationship with her daughter has changed. Pt reported, she has thought about buying a camper, generator, living in her car; or moving back to [place] (would be complicated). Pt reported, her great-grandson feel she hugs him too much, his parents has backed away from her. Pt reported, at two separate gatherings she was accused of being a racist.  Pt reported, a teenager at one of the gatherings only heard the end of a comment and accused he of being racist, which what she denies. Per pt, her grandsons parents apologized for her without knowing what was said or the context. Pt reported, she does not want to be here. Pt reported, last night she did research online to find the quickest way to go. Pt reported, she found household chemicals that can be used,  a bag with a drawstring, a hose and helium. Pt denies, SI, HI, AVH, self-injurious behaviors.   Pt denies, substance use. Pt denies, being linked to OPT resources (medication management and/or counseling.) Pt has a previous inpatient admission in 1990's at a facility [facility].  Past Psychiatric History: depression, anxiety, borderline personality  Risk to Self:  yes Risk to Others:  none Prior Inpatient Therapy:  multiple places Prior Outpatient Therapy:   none Past Medical History:  Past Medical History:  Diagnosis Date  . Depression   . Diabetes mellitus without complication ([place])   . GERD (gastroesophageal reflux disease)   . Hypercholesterolemia   . Osteoarthritis   . Sleep apnea    cannot tolerate CPAP    Past Surgical History:  Procedure Laterality Date  . BIOPSY  03/10/2017    Procedure: BIOPSY;  Surgeon: Daneil Dolin, MD;  [facility];  Service: Endoscopy;;  gastric  . BREAST BIOPSY     90's  . ESOPHAGOGASTRODUODENOSCOPY (EGD) WITH PROPOFOL N/A 03/10/2017   Procedure: ESOPHAGOGASTRODUODENOSCOPY (EGD) WITH PROPOFOL;  Surgeon: Daneil Dolin, MD;  [facility];  Service: Endoscopy;  Laterality: N/A;  8:30AM  . HYSTERECTOMY ABDOMINAL WITH SALPINGECTOMY    . KNEE SURGERY Left   . MALONEY DILATION N/A 03/10/2017   Procedure: Venia Minks DILATION;  Surgeon: Daneil Dolin, MD;  [facility];  Service: Endoscopy;  Laterality: N/A;  . NASAL SEPTUM SURGERY    . TUBAL LIGATION     Family History:  Family History  Problem Relation Age of Onset  . Heart failure Mother   . Heart disease Father   . Cancer Father        Lung  . Multiple sclerosis Daughter   . Diabetes Paternal Aunt   . Heart disease Maternal Grandmother   . Heart disease Maternal Grandfather   . Diabetes Paternal Grandmother   . Heart disease Paternal Grandmother   . Heart disease Paternal Grandfather   . Colon cancer Neg Hx   . Colon polyps Neg Hx    Family Psychiatric  History: none Social History:  Social History   Substance and Sexual Activity  Alcohol Use No     Social History   Substance and Sexual Activity  Drug Use No    Social History   Socioeconomic History  . Marital status: Divorced    Spouse name: Not on file  . Number of children: Not on file  . Years of education: Not on file  . Highest education level: Not on file  Occupational History  . Occupation: retired    Comment: [employer], worked in a lab   Tobacco Use  . Smoking status: Former Smoker    Packs/day: 1.00    Years: 26.00    Pack years: 26.00    Types: Cigarettes    Quit date: 12/09/2004    Years since quitting: 14.6  . Smokeless tobacco: Never Used  Vaping Use  . Vaping Use: Unknown  Substance and Sexual Activity  . Alcohol use: No  . Drug use: No  . Sexual  activity: Never    Birth control/protection: None  Other Topics Concern  . Not on file  Social History Narrative  . Not on file   Social Determinants of Health   Financial Resource Strain:   . Difficulty of Paying Living Expenses:   Food Insecurity:   . Worried About Charity fundraiser in the Last Year:   . Arboriculturist in the Last Year:   Transportation Needs:   . Film/video editor (Medical):   Marland Kitchen Lack of Transportation (Non-Medical):   Physical Activity:   . Days of Exercise per Week:   . Minutes of Exercise per Session:   Stress:   . Feeling of Stress :   Social Connections:   . Frequency of Communication with Friends and Family:   .  Frequency of Social Gatherings with Friends and Family:   . Attends Religious Services:   . Active Member of Clubs or Organizations:   . Attends Banker Meetings:   Marland Kitchen Marital Status:    Additional Social History:    Allergies:   Allergies  Allergen Reactions  . Excedrin Extra Strength [Asa-Apap-Caff Buffered] Diarrhea and Nausea Only  . Statins Other (See Comments)    Hair Loss  . Wellbutrin [Bupropion] Hives    Labs:  Results for orders placed or performed during the hospital encounter of 07/21/19 (from the past 48 hour(s))  Comprehensive metabolic panel     Status: Abnormal   Collection Time: 07/21/19  1:15 AM  Result Value Ref Range   Sodium 134 (L) 135 - 145 mmol/L   Potassium 4.3 3.5 - 5.1 mmol/L   Chloride 99 98 - 111 mmol/L   CO2 25 22 - 32 mmol/L   Glucose, Bld 219 (H) 70 - 99 mg/dL    Comment: Glucose reference range applies only to samples taken after fasting for at least 8 hours.   BUN 14 8 - 23 mg/dL   Creatinine, Ser 2.42 0.44 - 1.00 mg/dL   Calcium 9.1 8.9 - 35.3 mg/dL   Total Protein 7.8 6.5 - 8.1 g/dL   Albumin 4.1 3.5 - 5.0 g/dL   AST 40 15 - 41 U/L   ALT 48 (H) 0 - 44 U/L   Alkaline Phosphatase 97 38 - 126 U/L   Total Bilirubin 0.5 0.3 - 1.2 mg/dL   GFR calc non Af Amer >60 >60 mL/min    GFR calc Af Amer >60 >60 mL/min   Anion gap 10 5 - 15    Comment: Performed at [facility], [address], [place], [place] [address]  Ethanol     Status: None   Collection Time: 07/21/19  1:15 AM  Result Value Ref Range   Alcohol, Ethyl (B) <10 <10 mg/dL    Comment: (NOTE) Lowest detectable limit for serum alcohol is 10 mg/dL.  For medical purposes only. Performed at [facility], [address], [place], [place] [address]   Salicylate level     Status: Abnormal   Collection Time: 07/21/19  1:15 AM  Result Value Ref Range   Salicylate Lvl <7.0 (L) 7.0 - 30.0 mg/dL    Comment: Performed at [facility], [address], [place], [place] [address]  Acetaminophen level     Status: Abnormal   Collection Time: 07/21/19  1:15 AM  Result Value Ref Range   Acetaminophen (Tylenol), Serum <10 (L) 10 - 30 ug/mL    Comment: (NOTE) Therapeutic concentrations vary significantly. A range of 10-30 ug/mL  may be an effective concentration for many patients. However, some  are best treated at concentrations outside of this range. Acetaminophen concentrations >150 ug/mL at 4 hours after ingestion  and >50 ug/mL at 12 hours after ingestion are often associated with  toxic reactions.  Performed at [facility], [address], [place], [place] [address]   cbc     Status: Abnormal   Collection Time: 07/21/19  1:15 AM  Result Value Ref Range   WBC 10.4 4.0 - 10.5 K/uL   RBC 5.43 (H) 3.87 - 5.11 MIL/uL   Hemoglobin 13.4 12.0 - 15.0 g/dL   HCT 26.7 36 - 46 %   MCV 77.9 (L) 80.0 - 100.0 fL   MCH 24.7 (L) 26.0 - 34.0 pg  MCHC 31.7 30.0 - 36.0 g/dL   RDW 16.116.8 (H) 09.611.5 - 04.515.5 %   Platelets 318 150 - 400 K/uL   nRBC 0.0 0.0 - 0.2 %    Comment: Performed at [facility], [address] [place], [place] [address]  Rapid urine drug screen (hospital performed)     Status: Abnormal   Collection Time:  07/21/19  1:16 AM  Result Value Ref Range   Opiates NONE DETECTED NONE DETECTED   Cocaine NONE DETECTED NONE DETECTED   Benzodiazepines POSITIVE (A) NONE DETECTED   Amphetamines NONE DETECTED NONE DETECTED   Tetrahydrocannabinol NONE DETECTED NONE DETECTED   Barbiturates NONE DETECTED NONE DETECTED    Comment: (NOTE) DRUG SCREEN FOR MEDICAL PURPOSES ONLY.  IF CONFIRMATION IS NEEDED FOR ANY PURPOSE, NOTIFY LAB WITHIN 5 DAYS.  LOWEST DETECTABLE LIMITS FOR URINE DRUG SCREEN Drug Class                     Cutoff (ng/mL) Amphetamine and metabolites    1000 Barbiturate and metabolites    200 Benzodiazepine                 200 Tricyclics and metabolites     300 Opiates and metabolites        300 Cocaine and metabolites        300 THC                            50 Performed at [facility], [address] [place], [place] [address]   SARS Coronavirus 2 by RT PCR (hospital order, performed in [facility] hospital lab) Nasopharyngeal Nasopharyngeal Swab     Status: None   Collection Time: 07/21/19  1:49 AM   Specimen: Nasopharyngeal Swab  Result Value Ref Range   SARS Coronavirus 2 NEGATIVE NEGATIVE    Comment: (NOTE) SARS-CoV-2 target nucleic acids are NOT DETECTED.  The SARS-CoV-2 RNA is generally detectable in upper and lower respiratory specimens during the acute phase of infection. The lowest concentration of SARS-CoV-2 viral copies this assay can detect is 250 copies / mL. A negative result does not preclude SARS-CoV-2 infection and should not be used as the sole basis for treatment or other patient management decisions.  A negative result may occur with improper specimen collection / handling, submission of specimen other than nasopharyngeal swab, presence of viral mutation(s) within the areas targeted by this assay, and inadequate number of viral copies (<250 copies / mL). A negative result must be combined with clinical observations, patient history,  and epidemiological information.  Fact Sheet for Patients:   BoilerBrush.com.cyhttps://www.fda.gov/media/136312/download  Fact Sheet for Healthcare Providers: https://pope.com/https://www.fda.gov/media/136313/download  This test is not yet approved or  cleared by the [place] FDA and has been authorized for detection and/or diagnosis of SARS-CoV-2 by FDA under an Emergency Use Authorization (EUA).  This EUA will remain in effect (meaning this test can be used) for the duration of the COVID-19 declaration under Section 564(b)(1) of the Act, 21 U.S.C. section 360bbb-3(b)(1), unless the authorization is terminated or revoked sooner.  Performed at [facility], [address] [place], [place] [address]   CBG monitoring, ED     Status: Abnormal   Collection Time: 07/21/19  7:48 AM  Result Value Ref Range   Glucose-Capillary 166 (H) 70 - 99 mg/dL    Comment: Glucose reference range applies only to samples taken after fasting  for at least 8 hours.  CBG monitoring, ED     Status: Abnormal   Collection Time: 07/21/19  7:31 PM  Result Value Ref Range   Glucose-Capillary 173 (H) 70 - 99 mg/dL    Comment: Glucose reference range applies only to samples taken after fasting for at least 8 hours.  CBG monitoring, ED     Status: Abnormal   Collection Time: 07/22/19  9:45 AM  Result Value Ref Range   Glucose-Capillary 160 (H) 70 - 99 mg/dL    Comment: Glucose reference range applies only to samples taken after fasting for at least 8 hours.   Comment 1 Notify RN    Comment 2 Document in Chart     Current Facility-Administered Medications  Medication Dose Route Frequency Provider Last Rate Last Admin  . aspirin EC tablet 81 mg  81 mg Oral Daily Palumbo, April, MD   81 mg at 07/22/19 1004  . citalopram (CELEXA) tablet 40 mg  40 mg Oral QHS Palumbo, April, MD   40 mg at 07/21/19 2152  . docusate sodium (COLACE) capsule 100 mg  100 mg Oral Daily Palumbo, April, MD   100 mg at 07/22/19 1004  . furosemide  (LASIX) tablet 10 mg  10 mg Oral Daily Palumbo, April, MD   10 mg at 07/22/19 1004  . insulin glargine (LANTUS) injection 35 Units  35 Units Subcutaneous BID Palumbo, April, MD   35 Units at 07/22/19 1006  . LORazepam (ATIVAN) tablet 2 mg  2 mg Oral QHS Vanetta Mulders, MD   2 mg at 07/21/19 2153  . metFORMIN (GLUCOPHAGE-XR) 24 hr tablet 2,000 mg  2,000 mg Oral Q breakfast Palumbo, April, MD   2,000 mg at 07/22/19 1005  . nicotine (NICODERM CQ - dosed in mg/24 hours) patch 21 mg  21 mg Transdermal Daily Palumbo, April, MD      . pantoprazole (PROTONIX) EC tablet 40 mg  40 mg Oral Daily Palumbo, April, MD   40 mg at 07/21/19 0945  . pravastatin (PRAVACHOL) tablet 10 mg  10 mg Oral q1800 Palumbo, April, MD   10 mg at 07/21/19 1730   Current Outpatient Medications  Medication Sig Dispense Refill  . docusate sodium (COLACE) 100 MG capsule Take 100 mg by mouth daily.    . furosemide (LASIX) 20 MG tablet Take 10 mg by mouth daily.     . hydroxypropyl methylcellulose / hypromellose (ISOPTO TEARS / GONIOVISC) 2.5 % ophthalmic solution Place 1 drop into both eyes 3 (three) times daily as needed for dry eyes.    Marland Kitchen ibuprofen (ADVIL) 200 MG tablet Take 800 mg by mouth every 6 (six) hours as needed for headache or moderate pain.    . Insulin Degludec (TRESIBA) 100 UNIT/ML SOLN Inject 35 Units into the skin 2 (two) times daily.     Marland Kitchen levocetirizine (XYZAL) 5 MG tablet Take 5 mg by mouth in the morning.     . liraglutide (VICTOZA) 18 MG/3ML SOPN Inject 1.2 mg into the skin daily.    Marland Kitchen LORazepam (ATIVAN) 1 MG tablet Take 2 mg by mouth at bedtime.     . metFORMIN (GLUCOPHAGE-XR) 500 MG 24 hr tablet Take 2,000 mg by mouth daily.    . methylphenidate (RITALIN) 10 MG tablet Take 10 mg by mouth daily.     . Multiple Vitamin (MULTIVITAMIN WITH MINERALS) TABS tablet Take 1 tablet by mouth daily.    Marland Kitchen omeprazole (PRILOSEC) 40 MG capsule Take 40 mg by mouth  daily.    . pregabalin (LYRICA) 75 MG capsule Take 75 mg by  mouth in the morning.     Marland Kitchen aspirin EC 81 MG tablet Take 81 mg by mouth daily.    . citalopram (CELEXA) 40 MG tablet Take 40 mg by mouth at bedtime.     . diclofenac sodium (VOLTAREN) 1 % GEL Apply 4 g topically 4 (four) times daily. (Patient not taking: Reported on 07/21/2019) 4 g 5  . fluconazole (DIFLUCAN) 100 MG tablet Take 1 tablet (100 mg total) by mouth daily. (Patient not taking: Reported on 08/22/2018) 14 tablet 0  . lovastatin (MEVACOR) 10 MG tablet Take 10 mg by mouth at bedtime.      Musculoskeletal: Strength & Muscle Tone: within normal limits Gait & Station: normal Patient leans: N/A  Psychiatric Specialty Exam: Physical Exam  Nursing note and vitals reviewed. Constitutional: She is oriented to person, place, and time. She is cooperative.  HENT:  Head: Normocephalic.  Respiratory: Effort normal.  GI: Normal appearance.  Musculoskeletal:        General: Normal range of motion.     Cervical back: Normal range of motion.  Neurological: She is alert and oriented to person, place, and time.  Psychiatric: Her speech is normal and behavior is normal. Memory normal. Her mood appears anxious. She expresses impulsivity. She exhibits a depressed mood. She expresses suicidal ideation. She expresses suicidal plans.    Review of Systems  Psychiatric/Behavioral: Positive for dysphoric mood and suicidal ideas. The patient is nervous/anxious.   All other systems reviewed and are negative.   Blood pressure (!) 154/86, pulse 83, temperature 98.8 F (37.1 C), resp. rate 20, height  (1.626 m), weight 99.8 kg, SpO2 92 %.Body mass index is 37.76 kg/m.  General Appearance: Casual  Eye Contact:  Fair  Speech:  Normal Rate  Volume:  Normal  Mood:  Anxious and Depressed  Affect:  Congruent  Thought Process:  Coherent and Descriptions of Associations: Intact  Orientation:  Full (Time, Place, and Person)  Thought Content:  Rumination  Suicidal Thoughts:  Yes.  with intent/plan   Homicidal Thoughts:  No  Memory:  Immediate;   Fair Recent;   Fair Remote;   Fair  Judgement:  Poor  Insight:  Lacking  Psychomotor Activity:  Decreased  Concentration:  Concentration: Fair and Attention Span: Fair  Recall:  Fair  Fund of Knowledge:  Good  Language:  Good  Akathisia:  No  Handed:  Right  AIMS (if indicated):     Assets:  Housing Leisure Time Physical Health Resilience Social Support  ADL's:  Intact  Cognition:  WNL  Sleep:        Treatment Plan Summary: Daily contact with patient to assess and evaluate symptoms and progress in treatment, Medication management and Plan major depressive disorder, recurrent, severe without psychosis:  -Continue Celexa 40 mg daily -Start Depakote 250 mg BID for mood and anger issues  Anxiety: -Decrease Ativan 1 mg BID to daily  Disposition: Recommend psychiatric Inpatient admission when medically cleared.  Nanine Means, NP 07/22/2019 1:17 PM  Patient seen face-to-face for psychiatric evaluation, chart reviewed and case discussed with the physician extender and developed treatment plan. Reviewed the information documented and agree with the treatment plan. Thedore Mins, MD

## 2019-07-23 DIAGNOSIS — F332 Major depressive disorder, recurrent severe without psychotic features: Secondary | ICD-10-CM | POA: Diagnosis not present

## 2019-07-23 LAB — CBG MONITORING, ED
Glucose-Capillary: 388 mg/dL — ABNORMAL HIGH (ref 70–99)
Glucose-Capillary: 159 mg/dL — ABNORMAL HIGH (ref 70–99)
Glucose-Capillary: 285 mg/dL — ABNORMAL HIGH (ref 70–99)

## 2019-07-23 MED ORDER — INSULIN ASPART 100 UNIT/ML ~~LOC~~ SOLN
0.0000 [IU] | Freq: Three times a day (TID) | SUBCUTANEOUS | Status: DC
  Administered 2019-07-23: 3 [IU] via SUBCUTANEOUS
  Administered 2019-07-24: 5 [IU] via SUBCUTANEOUS
  Administered 2019-07-24: 3 [IU] via SUBCUTANEOUS
  Filled 2019-07-23: qty 0.15

## 2019-07-23 MED ORDER — LORAZEPAM 1 MG PO TABS
1.0000 mg | ORAL_TABLET | Freq: Four times a day (QID) | ORAL | Status: DC | PRN
  Administered 2019-07-23 (×2): 1 mg via ORAL
  Filled 2019-07-23 (×2): qty 1

## 2019-07-23 MED ORDER — INSULIN ASPART 100 UNIT/ML ~~LOC~~ SOLN
0.0000 [IU] | Freq: Every day | SUBCUTANEOUS | Status: DC
  Administered 2019-07-23: 3 [IU] via SUBCUTANEOUS
  Filled 2019-07-23: qty 0.05

## 2019-07-23 NOTE — Consult Note (Signed)
  Patient is seen and examined.  Patient is a 69 year old female with a past psychiatric history significant for depression and anxiety who presented to the [facility] emergency department on 07/21/2018 with suicidal ideation.  She stated that family discord was the major issue.  She was seen in consultation on 07/22/2019.  Her Celexa was continued at 40 mg p.o. daily and she was started on Depakote DR 250 mg p.o. twice daily for mood and anger issues.  Her lorazepam was also decreased to 1 mg p.o. twice daily.  On examination this morning she remains physically agitated and anxious.  She stated that "my father had that tremor like this to".  She goes into a long dialogue about the issues with her daughters, her grandchildren.  She admitted to continued suicidal ideation.  She stated in the past she had been treated with Ritalin as well as Effexor and nortriptyline.  She felt as though the Ritalin may have made things worse.  Her vital signs are stable, she is afebrile.  Review of her admission laboratories revealed an elevated glucose at 219.  Her glucose this morning was 388.  Her ALT was elevated at 48.  Otherwise her electrolytes were normal.  CBC showed a decrease in MCV and MCH.  Her acetaminophen and salicylate were both negative.  Carotid was negative.  Blood alcohol was less than 10, salicylate was less than 7.  Drug screen was positive for benzodiazepines.  In addition to the citalopram and Depakote she continues on furosemide, Lantus insulin, Metformin, Protonix and pravastatin.  Plan: 1.  Patient is a 69 year old female with the above-stated past psychiatric history who is seen in follow-up in the [facility] emergency department.  She continues to endorse suicidal ideation as well as anxiety.  I will go on and increase her lorazepam to 1 mg p.o. 3 times daily as needed agitation.  As well, I would recommend better control of her blood sugar prior to transfer.  We  will continue the Depakote and citalopram at their current dosages. 2.  Recommend improved control of diabetic treatment at this point, hopefully prior to transfer.

## 2019-07-23 NOTE — ED Notes (Signed)
Continues to be calm and cooperative with mild anxiety.

## 2019-07-23 NOTE — Progress Notes (Signed)
Inpatient Diabetes Program Recommendations  AACE/ADA: New Consensus Statement on Inpatient Glycemic Control (2015)  Target Ranges:  Prepandial:   less than 140 mg/dL      Peak postprandial:   less than 180 mg/dL (1-2 hours)      Critically ill patients:  140 - 180 mg/dL   Lab Results  Component Value Date   GLUCAP 388 (H) 07/23/2019    Review of Glycemic Control  Diabetes history: DM2 Outpatient Diabetes medications: Tresiba 35 units bid, metformin 2000 mg QAM, Victoza 1.2 mg QD Current orders for Inpatient glycemic control: Lantus 35 units bid, metformin 2000 mg QAM  Inpatient Diabetes Program Recommendations:     Add Novolog 0-15 units tidwc and hs HgbA1C to assess glycemic control PTA  Follow closely.  Thank you. Ailene Ards, RD, LDN, CDE Inpatient Diabetes Coordinator [phone]

## 2019-07-23 NOTE — ED Notes (Signed)
Pt's belongings moved to locker 34. Pt keeps her cane, okay per Diane RN

## 2019-07-23 NOTE — ED Notes (Signed)
Patient requesting something to read. Patient reports she reads her bible or devotional at night to help with her anxiety and depression. Patient given bible by MHT and patient took her PRN ativan to assist with her anxiety and sleep.

## 2019-07-23 NOTE — ED Notes (Signed)
Moved to Acute Unit where sitter is not required. Pt on camera and doing 15 minute checks. Pt aware.

## 2019-07-24 ENCOUNTER — Encounter: Payer: Self-pay | Admitting: Psychiatry

## 2019-07-24 ENCOUNTER — Other Ambulatory Visit: Payer: Self-pay

## 2019-07-24 ENCOUNTER — Inpatient Hospital Stay
Admission: EM | Admit: 2019-07-24 | Discharge: 2019-07-27 | DRG: 885 | Disposition: A | Discharge status: Home / Self Care | Payer: Medicare Other | Source: Intra-hospital | Attending: Psychiatry | Admitting: Psychiatry

## 2019-07-24 ENCOUNTER — Emergency Department (HOSPITAL_COMMUNITY): Payer: Medicare Other

## 2019-07-24 ENCOUNTER — Encounter (HOSPITAL_COMMUNITY): Payer: Self-pay | Admitting: Registered Nurse

## 2019-07-24 DIAGNOSIS — E119 Type 2 diabetes mellitus without complications: Secondary | ICD-10-CM | POA: Diagnosis present

## 2019-07-24 DIAGNOSIS — Z833 Family history of diabetes mellitus: Secondary | ICD-10-CM | POA: Diagnosis not present

## 2019-07-24 DIAGNOSIS — Z888 Allergy status to other drugs, medicaments and biological substances status: Secondary | ICD-10-CM

## 2019-07-24 DIAGNOSIS — Z7982 Long term (current) use of aspirin: Secondary | ICD-10-CM | POA: Diagnosis not present

## 2019-07-24 DIAGNOSIS — Z9851 Tubal ligation status: Secondary | ICD-10-CM | POA: Diagnosis not present

## 2019-07-24 DIAGNOSIS — F332 Major depressive disorder, recurrent severe without psychotic features: Secondary | ICD-10-CM | POA: Diagnosis not present

## 2019-07-24 DIAGNOSIS — Z7984 Long term (current) use of oral hypoglycemic drugs: Secondary | ICD-10-CM

## 2019-07-24 DIAGNOSIS — K219 Gastro-esophageal reflux disease without esophagitis: Secondary | ICD-10-CM | POA: Diagnosis present

## 2019-07-24 DIAGNOSIS — R41843 Psychomotor deficit: Secondary | ICD-10-CM | POA: Diagnosis present

## 2019-07-24 DIAGNOSIS — R45851 Suicidal ideations: Secondary | ICD-10-CM | POA: Diagnosis present

## 2019-07-24 DIAGNOSIS — G4730 Sleep apnea, unspecified: Secondary | ICD-10-CM | POA: Diagnosis present

## 2019-07-24 DIAGNOSIS — Z818 Family history of other mental and behavioral disorders: Secondary | ICD-10-CM

## 2019-07-24 DIAGNOSIS — Z87891 Personal history of nicotine dependence: Secondary | ICD-10-CM

## 2019-07-24 DIAGNOSIS — Z79899 Other long term (current) drug therapy: Secondary | ICD-10-CM

## 2019-07-24 DIAGNOSIS — M1990 Unspecified osteoarthritis, unspecified site: Secondary | ICD-10-CM | POA: Diagnosis present

## 2019-07-24 DIAGNOSIS — G4700 Insomnia, unspecified: Secondary | ICD-10-CM | POA: Diagnosis present

## 2019-07-24 DIAGNOSIS — E7800 Pure hypercholesterolemia, unspecified: Secondary | ICD-10-CM | POA: Diagnosis present

## 2019-07-24 LAB — URINALYSIS, ROUTINE W REFLEX MICROSCOPIC
Bilirubin Urine: NEGATIVE
Glucose, UA: NEGATIVE mg/dL
Hgb urine dipstick: NEGATIVE
Ketones, ur: NEGATIVE mg/dL
Nitrite: NEGATIVE
Protein, ur: NEGATIVE mg/dL
Specific Gravity, Urine: 1.015 (ref 1.005–1.030)
WBC, UA: >50 WBC/hpf — ABNORMAL HIGH (ref 0–5)
pH: 6 (ref 5.0–8.0)

## 2019-07-24 LAB — CBG MONITORING, ED
Glucose-Capillary: 135 mg/dL — ABNORMAL HIGH (ref 70–99)
Glucose-Capillary: 163 mg/dL — ABNORMAL HIGH (ref 70–99)
Glucose-Capillary: 201 mg/dL — ABNORMAL HIGH (ref 70–99)

## 2019-07-24 LAB — GLUCOSE, CAPILLARY
Glucose-Capillary: 138 mg/dL — ABNORMAL HIGH (ref 70–99)
Glucose-Capillary: 211 mg/dL — ABNORMAL HIGH (ref 70–99)

## 2019-07-24 MED ORDER — ACETAMINOPHEN 325 MG PO TABS
650.0000 mg | ORAL_TABLET | Freq: Four times a day (QID) | ORAL | Status: DC | PRN
  Administered 2019-07-25: 650 mg via ORAL
  Filled 2019-07-24: qty 2

## 2019-07-24 MED ORDER — INSULIN ASPART 100 UNIT/ML ~~LOC~~ SOLN
0.0000 [IU] | Freq: Three times a day (TID) | SUBCUTANEOUS | Status: DC
  Administered 2019-07-24: 2 [IU] via SUBCUTANEOUS
  Administered 2019-07-25: 5 [IU] via SUBCUTANEOUS
  Administered 2019-07-25: 2 [IU] via SUBCUTANEOUS
  Administered 2019-07-25: 3 [IU] via SUBCUTANEOUS
  Administered 2019-07-26: 5 [IU] via SUBCUTANEOUS
  Administered 2019-07-26: 3 [IU] via SUBCUTANEOUS
  Administered 2019-07-26: 8 [IU] via SUBCUTANEOUS
  Administered 2019-07-27: 3 [IU] via SUBCUTANEOUS
  Filled 2019-07-24 (×7): qty 1

## 2019-07-24 MED ORDER — ASPIRIN EC 81 MG PO TBEC
81.0000 mg | DELAYED_RELEASE_TABLET | Freq: Every day | ORAL | Status: DC
  Administered 2019-07-25: 81 mg via ORAL
  Filled 2019-07-24: qty 1

## 2019-07-24 MED ORDER — LORAZEPAM 1 MG PO TABS
1.0000 mg | ORAL_TABLET | Freq: Four times a day (QID) | ORAL | Status: DC | PRN
  Administered 2019-07-24: 1 mg via ORAL
  Filled 2019-07-24: qty 1

## 2019-07-24 MED ORDER — MAGNESIUM HYDROXIDE 400 MG/5ML PO SUSP: 30.0000 mL | Freq: Every day | ORAL | Status: DC | PRN

## 2019-07-24 MED ORDER — INSULIN ASPART 100 UNIT/ML ~~LOC~~ SOLN
0.0000 [IU] | Freq: Every day | SUBCUTANEOUS | Status: DC
  Administered 2019-07-24 – 2019-07-26 (×3): 2 [IU] via SUBCUTANEOUS
  Filled 2019-07-24 (×3): qty 1

## 2019-07-24 MED ORDER — DOCUSATE SODIUM 100 MG PO CAPS
100.0000 mg | ORAL_CAPSULE | Freq: Every day | ORAL | Status: DC
  Administered 2019-07-25 – 2019-07-27 (×3): 100 mg via ORAL
  Filled 2019-07-24 (×3): qty 1

## 2019-07-24 MED ORDER — PRAVASTATIN SODIUM 10 MG PO TABS
10.0000 mg | ORAL_TABLET | Freq: Every day | ORAL | Status: DC
  Administered 2019-07-24 – 2019-07-26 (×3): 10 mg via ORAL
  Filled 2019-07-24 (×4): qty 1

## 2019-07-24 MED ORDER — INSULIN GLARGINE 100 UNIT/ML ~~LOC~~ SOLN
35.0000 [IU] | Freq: Two times a day (BID) | SUBCUTANEOUS | Status: DC
  Administered 2019-07-24 – 2019-07-27 (×6): 35 [IU] via SUBCUTANEOUS
  Filled 2019-07-24 (×7): qty 0.35

## 2019-07-24 MED ORDER — PANTOPRAZOLE SODIUM 40 MG PO TBEC
40.0000 mg | DELAYED_RELEASE_TABLET | Freq: Every day | ORAL | Status: DC
  Filled 2019-07-24: qty 1

## 2019-07-24 MED ORDER — ALUM & MAG HYDROXIDE-SIMETH 200-200-20 MG/5ML PO SUSP: 30.0000 mL | ORAL | Status: DC | PRN

## 2019-07-24 MED ORDER — METFORMIN HCL ER 750 MG PO TB24
2000.0000 mg | ORAL_TABLET | Freq: Every day | ORAL | Status: DC
  Administered 2019-07-25: 2000 mg via ORAL
  Filled 2019-07-24: qty 1

## 2019-07-24 MED ORDER — CITALOPRAM HYDROBROMIDE 20 MG PO TABS
40.0000 mg | ORAL_TABLET | Freq: Every day | ORAL | Status: DC
  Administered 2019-07-24: 40 mg via ORAL
  Filled 2019-07-24: qty 2

## 2019-07-24 MED ORDER — NICOTINE 21 MG/24HR TD PT24
21.0000 mg | MEDICATED_PATCH | Freq: Every day | TRANSDERMAL | Status: DC
  Filled 2019-07-24 (×2): qty 1

## 2019-07-24 NOTE — Consult Note (Addendum)
[facility] Psych ED Discharge  07/24/2019 12:44 PM Maria Williamson  MRN:  [id number] Principal Problem: Major depressive disorder, recurrent severe without psychotic features [facility]) Discharge Diagnoses: Principal Problem:   Major depressive disorder, recurrent severe without psychotic features (HCC) Active Problems:   Borderline personality disorder (HCC)   Subjective: [name], 69 y.o., female patient seen face to face by this provider, Dr. Lucianne Muss; and chart reviewed on 07/24/19.  On evaluation Maria Williamson reports she is feeling better.  Stating adjusting to the change in relationship between her and her daughter, feeling lonely and not getting out as much as she use to prior to Covid.  Patient states she has talked with family and is feeling better and feels that she would benefit for outpatient psychiatric services.  States that she would not kill herself because she knows that she is loved and that she would not want to do that to her grandchildren.  Patient states that medication is working.  Gave permission to speak to her daughter for collateral information.  During evaluation Maria Williamson is alert/oriented x 4; calm/cooperative; and mood is congruent with affect.  She does not appear to be responding to internal/external stimuli or delusional thoughts.  Patient denies suicidal/self-harm/homicidal ideation, psychosis, and paranoia.  Patient answered question appropriately.     Total Time spent with patient: 30 minutes  Past Psychiatric History: See above  Past Medical History:  Past Medical History:  Diagnosis Date  . Depression   . Diabetes mellitus without complication (HCC)   . GERD (gastroesophageal reflux disease)   . Hypercholesterolemia   . Osteoarthritis   . Sleep apnea    cannot tolerate CPAP    Past Surgical History:  Procedure Laterality Date  . BIOPSY  03/10/2017   Procedure: BIOPSY;  Surgeon: Corbin Ade, MD;  [facility]: [facility];  Service: Endoscopy;;   gastric  . BREAST BIOPSY     90's  . ESOPHAGOGASTRODUODENOSCOPY (EGD) WITH PROPOFOL N/A 03/10/2017   Procedure: ESOPHAGOGASTRODUODENOSCOPY (EGD) WITH PROPOFOL;  Surgeon: Corbin Ade, MD;  Location: [facility];  Service: Endoscopy;  Laterality: N/A;  8:30AM  . HYSTERECTOMY ABDOMINAL WITH SALPINGECTOMY    . KNEE SURGERY Left   . MALONEY DILATION N/A 03/10/2017   Procedure: [name] DILATION;  Surgeon: Corbin Ade, MD;  Location: [facility];  Service: Endoscopy;  Laterality: N/A;  . NASAL SEPTUM SURGERY    . TUBAL LIGATION     Family History:  Family History  Problem Relation Age of Onset  . Heart failure Mother   . Heart disease Father   . Cancer Father        Lung  . Multiple sclerosis Daughter   . Diabetes Paternal Aunt   . Heart disease Maternal Grandmother   . Heart disease Maternal Grandfather   . Diabetes Paternal Grandmother   . Heart disease Paternal Grandmother   . Heart disease Paternal Grandfather   . Colon cancer Neg Hx   . Colon polyps Neg Hx    Family Psychiatric  History: See above Social History:  Social History   Substance and Sexual Activity  Alcohol Use No     Social History   Substance and Sexual Activity  Drug Use No    Social History   Socioeconomic History  . Marital status: Divorced    Spouse name: Not on file  . Number of children: Not on file  . Years of education: Not on file  . Highest education  level: Not on file  Occupational History  . 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 Smoking status: Former Smoker    Packs/day: 1.00    Years: 26.00    Pack years: 26.00    Types: Cigarettes    Quit date: 12/09/2004    Years since quitting: 14.6  . Smokeless tobacco: Never Used  Vaping Use  . Vaping Use: Unknown  Substance and Sexual Activity  . Alcohol use: No  . Drug use: No  . Sexual activity: Never    Birth control/protection: None  Other Topics Concern  . Not on file  Social  History Narrative  . Not on file   Social Determinants of Health   Financial Resource Strain:   . Difficulty of Paying Living Expenses:   Food Insecurity:   . Worried About Programme researcher, broadcasting/film/video in the Last Year:   . Barista in the Last Year:   Transportation Needs:   . Freight forwarder (Medical):   Marland Kitchen Lack of Transportation (Non-Medical):   Physical Activity:   . Days of Exercise per Week:   . Minutes of Exercise per Session:   Stress:   . Feeling of Stress :   Social Connections:   . Frequency of Communication with Friends and Family:   . Frequency of Social Gatherings with Friends and Family:   . Attends Religious Services:   . Active Member of Clubs or Organizations:   . Attends Banker Meetings:   Marland Kitchen Marital Status:     Has this patient used any form of tobacco in the last 30 days? (Cigarettes, Smokeless Tobacco, Cigars, and/or Pipes) Prescription not provided because: No tobacco products  Current Medications: Current Facility-Administered Medications  Medication Dose Route Frequency Provider Last Rate Last Admin  . aspirin EC tablet 81 mg  81 mg Oral Daily Palumbo, April, MD   81 mg at 07/24/19 0921  . citalopram (CELEXA) tablet 40 mg  40 mg Oral QHS Palumbo, April, MD   40 mg at 07/23/19 2133  . divalproex (DEPAKOTE SPRINKLE) capsule 250 mg  250 mg Oral Q12H Charm Rings, NP   250 mg at 07/24/19 1114  . docusate sodium (COLACE) capsule 100 mg  100 mg Oral Daily Palumbo, April, MD   100 mg at 07/23/19 1052  . furosemide (LASIX) tablet 10 mg  10 mg Oral Daily Palumbo, April, MD   10 mg at 07/24/19 1113  . insulin aspart (novoLOG) injection 0-15 Units  0-15 Units Subcutaneous TID WC Wynetta Fines, MD   5 Units at 07/24/19 1112  . insulin aspart (novoLOG) injection 0-5 Units  0-5 Units Subcutaneous QHS Wynetta Fines, MD   3 Units at 07/23/19 2136  . insulin glargine (LANTUS) injection 35 Units  35 Units Subcutaneous BID Palumbo, April, MD   35  Units at 07/24/19 1113  . LORazepam (ATIVAN) tablet 1 mg  1 mg Oral Q6H PRN Antonieta Pert, MD   1 mg at 07/23/19 2138  . metFORMIN (GLUCOPHAGE-XR) 24 hr tablet 2,000 mg  2,000 mg Oral Q breakfast Palumbo, April, MD   2,000 mg at 07/23/19 0912  . nicotine (NICODERM CQ - dosed in mg/24 hours) patch 21 mg  21 mg Transdermal Daily Palumbo, April, MD      . pantoprazole (PROTONIX) EC tablet 40 mg  40 mg Oral Daily Palumbo, April, MD   40 mg at 07/21/19  0945  . pravastatin (PRAVACHOL) tablet 10 mg  10 mg Oral q1800 Palumbo, April, MD   10 mg at 07/23/19 1814   Current Outpatient Medications  Medication Sig Dispense Refill  . docusate sodium (COLACE) 100 MG capsule Take 100 mg by mouth daily.    . furosemide (LASIX) 20 MG tablet Take 10 mg by mouth daily.     . hydroxypropyl methylcellulose / hypromellose (ISOPTO TEARS / GONIOVISC) 2.5 % ophthalmic solution Place 1 drop into both eyes 3 (three) times daily as needed for dry eyes.    Marland Kitchen ibuprofen (ADVIL) 200 MG tablet Take 800 mg by mouth every 6 (six) hours as needed for headache or moderate pain.    . Insulin Degludec (TRESIBA) 100 UNIT/ML SOLN Inject 35 Units into the skin 2 (two) times daily.     Marland Kitchen levocetirizine (XYZAL) 5 MG tablet Take 5 mg by mouth in the morning.     . liraglutide (VICTOZA) 18 MG/3ML SOPN Inject 1.2 mg into the skin daily.    Marland Kitchen LORazepam (ATIVAN) 1 MG tablet Take 2 mg by mouth at bedtime.     . metFORMIN (GLUCOPHAGE-XR) 500 MG 24 hr tablet Take 2,000 mg by mouth daily.    . methylphenidate (RITALIN) 10 MG tablet Take 10 mg by mouth daily.     . Multiple Vitamin (MULTIVITAMIN WITH MINERALS) TABS tablet Take 1 tablet by mouth daily.    Marland Kitchen omeprazole (PRILOSEC) 40 MG capsule Take 40 mg by mouth daily.    . pregabalin (LYRICA) 75 MG capsule Take 75 mg by mouth in the morning.     Marland Kitchen aspirin EC 81 MG tablet Take 81 mg by mouth daily.    . citalopram (CELEXA) 40 MG tablet Take 40 mg by mouth at bedtime.     . diclofenac sodium  (VOLTAREN) 1 % GEL Apply 4 g topically 4 (four) times daily. (Patient not taking: Reported on 07/21/2019) 4 g 5  . fluconazole (DIFLUCAN) 100 MG tablet Take 1 tablet (100 mg total) by mouth daily. (Patient not taking: Reported on 08/22/2018) 14 tablet 0  . lovastatin (MEVACOR) 10 MG tablet Take 10 mg by mouth at bedtime.     PTA Medications: (Not in a hospital admission)   Musculoskeletal: Strength & Muscle Tone: within normal limits Gait & Station: normal Patient leans: N/A  Psychiatric Specialty Exam: Physical Exam Vitals and nursing note reviewed. Exam conducted with a chaperone present.  Constitutional:      Appearance: Normal appearance.  Pulmonary:     Effort: Pulmonary effort is normal.  Skin:    General: Skin is warm and dry.  Neurological:     Mental Status: She is alert.  Psychiatric:        Attention and Perception: Attention and perception normal.        Mood and Affect: Mood and affect normal.        Speech: Speech normal.        Behavior: Behavior normal. Behavior is cooperative.        Thought Content: Thought content is delusional. Thought content is not paranoid. Thought content does not include homicidal or suicidal ideation.        Cognition and Memory: Cognition is impaired. Memory is impaired.     Review of Systems  Psychiatric/Behavioral: Agitation: Denies. Behavioral problem: Denies. Hallucinations: Denies. Self-injury: Denies. Sleep disturbance: Improved  Suicidal ideas: Denies.  States that she would not kill self related to region and not wantin to hurt her family.  Nervous/anxious: Stable.        Patient stating she is feeling much better and has talked to her family; and is able to hold a discussion without crying now and feels that she would do better in an outpatient psychiatric program.  States that her family is supportive and willing to help.    All other systems reviewed and are negative.   Blood pressure 107/86, pulse 80, temperature 97.6 F (36.4  C), temperature source Oral, resp. rate 20, height 5\' 4"  (1.626 m), weight 99.8 kg, SpO2 96 %.Body mass index is 37.76 kg/m.  General Appearance: Casual  Eye Contact:  Good  Speech:  Clear and Coherent and Normal Rate  Volume:  Normal  Mood:  Depressed  Affect:  Appropriate, Congruent and Depressed  Thought Process:  Coherent, Goal Directed and Descriptions of Associations: Intact  Orientation:  Full (Time, Place, and Person)  Thought Content:  WDL and Logical  Suicidal Thoughts:  No  Homicidal Thoughts:  No  Memory:  Immediate;   Good Recent;   Good Remote;   Good  Judgement:  Intact  Insight:  Present  Psychomotor Activity:  Normal  Concentration:  Concentration: Good and Attention Span: Good  Recall:  Good  Fund of Knowledge:  Good  Language:  Good  Akathisia:  No  Handed:  Right  AIMS (if indicated):     Assets:  Communication Skills Desire for Improvement Housing Physical Health Social Support Transportation  ADL's:  Intact  Cognition:  WNL  Sleep:        Demographic Factors:  Caucasian  Loss Factors: She and oldest daughter having problems  Historical Factors: NA  Risk Reduction Factors:   Sense of responsibility to family, Religious beliefs about death, Living with another person, especially a relative and Positive social support  Continued Clinical Symptoms:  Previous Psychiatric Diagnoses and Treatments  Cognitive Features That Contribute To Risk:  None    Suicide Risk:  Minimal: No identifiable suicidal ideation.  Patients presenting with no risk factors but with morbid ruminations; may be classified as minimal risk based on the severity of the depressive symptoms   Plan Of Care/Follow-up recommendations:  Activity:  As tolerated Diet:  Heart healthy  Recommendation:  PHP program (outpatient psychiatric services)  Continue current medications.  TTS to speak with daughter for collateral information and safety plan  Disposition:  Patient  does not meet criteria for psychiatric inpatient admission.   Addendum Collateral information from daughter of patient stating that she is afraid the her mother will kill herself once she is discharged.  Will continue to monitor patient and recommend inpatient psychiatric treatment.     Renay Crammer, NP 07/24/2019, 12:44 PM

## 2019-07-24 NOTE — Tx Team (Signed)
Initial Treatment Plan 07/24/2019 5:57 PM Maria Williamson TGR:[id number]    PATIENT STRESSORS: Loss of relationship with daughter Other: Death of family and pet dog   PATIENT STRENGTHS: Ability for insight Motivation for treatment/growth   PATIENT IDENTIFIED PROBLEMS: Ineffective coping skills  Lack of support system                   DISCHARGE CRITERIA:  Motivation to continue treatment in a less acute level of care Verbal commitment to aftercare and medication compliance  PRELIMINARY DISCHARGE PLAN: Outpatient therapy Participate in family therapy Return to previous living arrangement Return to previous work or school arrangements  PATIENT/FAMILY INVOLVEMENT: This treatment plan has been presented to and reviewed with the patient, Maria Williamson, and/or family member.  The patient and family have been given the opportunity to ask questions and make suggestions.  Sharin Mons, RN 07/24/2019, 5:57 PM

## 2019-07-24 NOTE — ED Notes (Signed)
TTS at bedside. 

## 2019-07-24 NOTE — BHH Counselor (Signed)
This counselor spoke with patient's daughter, [name] [phone]: This counselor explained that our provider would be psychiatrically clearing her mother and referring her to our [facility] program. This counselor reported to [facility] that patient felt she was responding well to her mood stabilizers and could receive more immediate treatment in PHP as she is currently waiting for gero-psych placement. She states that she is not at all comfortable with her mother being discharged. She states her mother has threatened suicide many times in the past but this is the first time she developed a plan. She reports her mother lives in the home with her and she was works 40 hours a week so cannot provide supervision to keep her mother safe.   Will discuss collateral with provider.

## 2019-07-24 NOTE — BH Assessment (Signed)
BHH Assessment Progress Note  Per Assunta Found, FNP, this volutary pt requires psychiatric hospitalization.  Maria Williamson, CSW reports that pt has been accepted to [facility] by Dr Toni Amend to Rm 303.  Shuvon agrees to this disposition.  Pt's nurse has been notified, and agrees to call report to [phone].  Pt is to be transported via [employer].  Pt was transferred before this writer could review consent forms with her.  Hulen Shouts has been notified.     Doylene Canning, [place] Behavioral Health Coordinator [phone]

## 2019-07-24 NOTE — ED Notes (Signed)
Report called to [facility], Charity fundraiser at [employer]  Safe transport contact for transport.

## 2019-07-24 NOTE — ED Notes (Signed)
Patient discharged to [facility] with Safe Transport.  Belongings bag sent with patient.

## 2019-07-24 NOTE — ED Notes (Signed)
Patient transported to X-ray 

## 2019-07-24 NOTE — Progress Notes (Signed)
Maria Williamson is a 69 y.o. female patient admitted to [facility] with suicidal threats.  Patient was seen and evaluated in person at [facility]. Pt reports that she was upset regarding family discord and became very emotional.  Then she started researching ways to die when she thought that her daughter and son-in-law were out of the house.  Evidently they were not out of the house and she confessed to them what she was doing.  They brought her to the emergency department concern for her safety.  Now the patient minimizes her actions.  She did give this provider permission to speak with her daughter [name].  Once at [facility] pt denies experiencing any SI, HI, AVH. Pt reports experiencing some anxiety and depression rated 5/10 r/t thinking she was going to discharge today and ended up being moved to here. Pt reports that she is okay with being here because she wants to get help and develop coping skills for anxiety, depression, communication, so her relationship can improve with her daughter. Pt also states she wants a real diagnosis.   Skin assessment preformed with Kemiya [facility]. Pt has rose tattoo on chx, bug bites on L arm, foot, forehead and ankle. Pt also has eczema on L hand on tumb.

## 2019-07-24 NOTE — BH Assessment (Signed)
Patient can come Now  Patient has been accepted to [facility].  Accepting physician is Dr. Toni Amend.  Attending Physician will be Dr. Toni Amend.  Patient has been assigned to room 303, by [facility] Charge Nurse [place].   Call report to [phone].  Representative/Transfer Coordinator is Lesleyanne Politte  Patient pre-admitted by [facility] Patient Access [facility])  [facility] ER Staff (Tom, TTS Counselor) made aware of acceptance.

## 2019-07-25 DIAGNOSIS — E119 Type 2 diabetes mellitus without complications: Secondary | ICD-10-CM

## 2019-07-25 DIAGNOSIS — F332 Major depressive disorder, recurrent severe without psychotic features: Principal | ICD-10-CM

## 2019-07-25 LAB — GLUCOSE, CAPILLARY
Glucose-Capillary: 144 mg/dL — ABNORMAL HIGH (ref 70–99)
Glucose-Capillary: 204 mg/dL — ABNORMAL HIGH (ref 70–99)
Glucose-Capillary: 194 mg/dL — ABNORMAL HIGH (ref 70–99)
Glucose-Capillary: 239 mg/dL — ABNORMAL HIGH (ref 70–99)

## 2019-07-25 MED ORDER — FUROSEMIDE 20 MG PO TABS
10.0000 mg | ORAL_TABLET | Freq: Every day | ORAL | Status: DC
  Administered 2019-07-25 – 2019-07-27 (×3): 10 mg via ORAL
  Filled 2019-07-25 (×3): qty 0.5

## 2019-07-25 MED ORDER — METFORMIN HCL ER 750 MG PO TB24
2000.0000 mg | ORAL_TABLET | Freq: Every day | ORAL | Status: DC
  Administered 2019-07-26 – 2019-07-27 (×2): 2000 mg via ORAL
  Filled 2019-07-25 (×2): qty 1

## 2019-07-25 MED ORDER — DULOXETINE HCL 30 MG PO CPEP
30.0000 mg | ORAL_CAPSULE | Freq: Every day | ORAL | Status: DC
  Administered 2019-07-25: 30 mg via ORAL
  Filled 2019-07-25: qty 1

## 2019-07-25 MED ORDER — LORATADINE 10 MG PO TABS
10.0000 mg | ORAL_TABLET | Freq: Every day | ORAL | Status: DC
  Administered 2019-07-25 – 2019-07-27 (×3): 10 mg via ORAL
  Filled 2019-07-25 (×3): qty 1

## 2019-07-25 MED ORDER — ASPIRIN EC 81 MG PO TBEC
81.0000 mg | DELAYED_RELEASE_TABLET | Freq: Every day | ORAL | Status: DC
  Administered 2019-07-26: 81 mg via ORAL
  Filled 2019-07-25: qty 1

## 2019-07-25 MED ORDER — CITALOPRAM HYDROBROMIDE 20 MG PO TABS
20.0000 mg | ORAL_TABLET | Freq: Every day | ORAL | Status: DC
  Administered 2019-07-25: 20 mg via ORAL
  Filled 2019-07-25: qty 1

## 2019-07-25 MED ORDER — FAMOTIDINE 20 MG PO TABS
20.0000 mg | ORAL_TABLET | Freq: Two times a day (BID) | ORAL | Status: DC
  Administered 2019-07-25 – 2019-07-27 (×5): 20 mg via ORAL
  Filled 2019-07-25 (×5): qty 1

## 2019-07-25 MED ORDER — LORAZEPAM 2 MG PO TABS
2.0000 mg | ORAL_TABLET | Freq: Every day | ORAL | Status: DC
  Administered 2019-07-25 – 2019-07-26 (×2): 2 mg via ORAL
  Filled 2019-07-25 (×2): qty 1

## 2019-07-25 NOTE — Progress Notes (Addendum)
Recreation Therapy Notes   Date: 07/25/2019  Time: 9:30 am  Location: Craft room  Behavioral response: Appropriate  Intervention Topic: Decision Making    Discussion/Intervention:  Group content today was focused on Decision making. The group defined decision making and some positive ways they make decisions for themselves. Individuals expressed reasons why they neglected any decision making in the past. Patients described ways to improve decision making skills in the future. The group explained what could happen if they did not do any decision making at all. Participants express how bad decision has affected them and others around them. Individual explained the importance of decision making. The group participated in the intervention "Making decisions" where they had a chance to discover some of their weaknesses and strengths in decision making. Patient came up with a new decision-making skill to improve themselves in the future.  Clinical Observations/Feedback:  Patient came to group and defined decision-making as making up your mind. She expressed that decision- making increasing her anxiety so she tries to pray about things first. Participant explained that decision making is an issue for her because of the fear of making the wrong choice. Individual was social with peers and staff while participating on the intervention.  Maria Williamson LRT/CTRS         [name] 07/25/2019 11:38 AM

## 2019-07-25 NOTE — BHH Suicide Risk Assessment (Signed)
[facility] Admission Suicide Risk Assessment   Nursing information obtained from:  Patient Demographic factors:  Age 69 or older, Caucasian Current Mental Status:  Self-harm thoughts Loss Factors:  Loss of significant relationship Historical Factors:  Family history of mental illness or substance abuse Risk Reduction Factors:  Living with another person, especially a relative  Total Time spent with patient: 1 hour Principal Problem: Severe recurrent major depression without psychotic features (HCC) Diagnosis:  Principal Problem:   Severe recurrent major depression without psychotic features (HCC) Active Problems:   GERD (gastroesophageal reflux disease)   Diabetes (HCC)  Subjective Data: Patient seen and chart reviewed.  69 year old woman with severe recurrent depression brought to the hospital after telling her children that she was researching ways to kill her self.  Patient reports depressed mood and suicidal thoughts but denies any current intent or plan to act on it.  Denies psychotic symptoms.  Expresses openness to treatment  Continued Clinical Symptoms:    The "Alcohol Use Disorders Identification Test", Guidelines for Use in Primary Care, Second Edition.  World Science writer [facility]). Score between 0-7:  no or low risk or alcohol related problems. Score between 8-15:  moderate risk of alcohol related problems. Score between 16-19:  high risk of alcohol related problems. Score 20 or above:  warrants further diagnostic evaluation for alcohol dependence and treatment.   CLINICAL FACTORS:   Depression:   Hopelessness Impulsivity Severe   Musculoskeletal: Strength & Muscle Tone: within normal limits Gait & Station: normal Patient leans: N/A  Psychiatric Specialty Exam: Physical Exam  Nursing note and vitals reviewed. Constitutional: She appears well-developed. She is cooperative.  HENT:  Head: Normocephalic and atraumatic.  Eyes: Pupils are equal, round, and reactive to  light. Conjunctivae are normal.  Cardiovascular: Normal heart sounds.  Respiratory: Effort normal.  GI: Soft.  Musculoskeletal:        General: Normal range of motion.     Cervical back: Normal range of motion.  Neurological: She is alert.  Skin: Skin is warm and dry.  Psychiatric: Her speech is normal. She is not agitated. She expresses impulsivity. She exhibits a depressed mood. She expresses suicidal ideation. She expresses suicidal plans.    Review of Systems  Constitutional: Negative.   HENT: Negative.   Eyes: Negative.   Respiratory: Negative.   Cardiovascular: Negative.   Gastrointestinal: Negative.   Musculoskeletal: Negative.   Skin: Negative.   Neurological: Negative.   Psychiatric/Behavioral: Positive for dysphoric mood, sleep disturbance and suicidal ideas. The patient is nervous/anxious.     Blood pressure 138/77, pulse 85, temperature 97.9 F (36.6 C), temperature source Oral, resp. rate 17, height 5' 4.5" (1.638 m), weight 98.4 kg, SpO2 95 %.Body mass index is 36.67 kg/m.  General Appearance: Casual  Eye Contact:  Good  Speech:  Clear and Coherent  Volume:  Normal  Mood:  Depressed  Affect:  Appropriate  Thought Process:  Goal Directed  Orientation:  Full (Time, Place, and Person)  Thought Content:  Logical  Suicidal Thoughts:  Yes.  with intent/plan  Homicidal Thoughts:  No  Memory:  Immediate;   Fair Recent;   Fair Remote;   Fair  Judgement:  Fair  Insight:  Fair  Psychomotor Activity:  Normal  Concentration:  Concentration: Fair  Recall:  [employer] of Knowledge:  Fair  Language:  Fair  Akathisia:  No  Handed:  Right  AIMS (if indicated):     Assets:  Desire for Improvement Housing Resilience  ADL's:  Intact  Cognition:  WNL  Sleep:  Number of Hours: 7.75      COGNITIVE FEATURES THAT CONTRIBUTE TO RISK:  Thought constriction (tunnel vision)    SUICIDE RISK:   Mild:  Suicidal ideation of limited frequency, intensity, duration, and  specificity.  There are no identifiable plans, no associated intent, mild dysphoria and related symptoms, good self-control (both objective and subjective assessment), few other risk factors, and identifiable protective factors, including available and accessible social support.  PLAN OF CARE: Continue 15-minute checks.  Work on adjustment of medication for treatment of depression.  Engage in individual and group therapy.  Monitor blood sugars and other physical parameters.  Work on discharge planning  I certify that inpatient services furnished can reasonably be expected to improve the patient's condition.   Alethia Berthold, MD 07/25/2019, 3:42 PM

## 2019-07-25 NOTE — H&P (Signed)
Psychiatric Admission Assessment Adult  Patient Identification: Maria Williamson MRN:  [id number] Date of Evaluation:  07/25/2019 Chief Complaint:  Severe recurrent major depression without psychotic features (HCC) [F33.2] Principal Diagnosis: Severe recurrent major depression without psychotic features (HCC) Diagnosis:  Principal Problem:   Severe recurrent major depression without psychotic features (HCC) Active Problems:   GERD (gastroesophageal reflux disease)   Diabetes (HCC)  History of Present Illness: Patient seen and chart reviewed.  69 year old woman with a longstanding history of mood problems brought to the hospital after family discovered that she had been researching suicide methods.  Patient says that she has been depressed all of her life with particular worsening for the last few months.  Feels down negative tired and depressed most of the time.  Recently she had some conflict with some of her immediate family which left her feeling even more hopeless and alone.  She was going on the computer and looking for painless ways to kill herself and discovered the idea of using helium in a plastic bag.  When her oldest daughter, with whom she lives, found this out she insisted the patient come to the hospital.  Patient admits to having suicidal thoughts but denies any current intent or plan.  Denies any psychotic symptoms.  Denies that she is using alcohol or any drugs recently.  She has been taking antidepressant medicine which she has been getting from her primary providers most recently Celexa 40 mg a day Ativan 2 mg at night. Associated Signs/Symptoms: Depression Symptoms:  depressed mood, insomnia, psychomotor retardation, hopelessness, suicidal thoughts with specific plan, (Hypo) Manic Symptoms:  Impulsivity, Anxiety Symptoms:  Excessive Worry, Psychotic Symptoms:  None reported PTSD Symptoms: Negative Total Time spent with patient: 1 hour  Past Psychiatric History: Past  history of longstanding depression.  1 prior hospitalization in the 1980s.  Some therapy intermediately but mostly medication management.  She remembers having been tried on Effexor in the past with only partial improvement.  Current Celexa have been unchanged for years.  She denies ever having actually tried to kill her self in the past.  Patient denies any manic episodes denies any psychotic episodes has never been diagnosed with bipolar disorder  Is the patient at risk to self? Yes.    Has the patient been a risk to self in the past 6 months? Yes.    Has the patient been a risk to self within the distant past? Yes.    Is the patient a risk to others? No.  Has the patient been a risk to others in the past 6 months? No.  Has the patient been a risk to others within the distant past? No.   Prior Inpatient Therapy:   Prior Outpatient Therapy:    Alcohol Screening: Patient refused Alcohol Screening Tool: Yes 1. How often do you have a drink containing alcohol?: Never 2. How many drinks containing alcohol do you have on a typical day when you are drinking?: 1 or 2 3. How often do you have six or more drinks on one occasion?: Never AUDIT-C Score: 0 Alcohol Brief Interventions/Follow-up: Patient Refused, Alcohol Education Substance Abuse History in the last 12 months:  No. Consequences of Substance Abuse: Negative Previous Psychotropic Medications: Yes  Psychological Evaluations: Yes  Past Medical History:  Past Medical History:  Diagnosis Date  . Depression   . Diabetes mellitus without complication (HCC)   . GERD (gastroesophageal reflux disease)   . Hypercholesterolemia   . Osteoarthritis   . Sleep apnea  cannot tolerate CPAP    Past Surgical History:  Procedure Laterality Date  . BIOPSY  03/10/2017   Procedure: BIOPSY;  Surgeon: Daneil Dolin, MD;  [facility];  Service: Endoscopy;;  gastric  . BREAST BIOPSY     90's  . ESOPHAGOGASTRODUODENOSCOPY (EGD) WITH  PROPOFOL N/A 03/10/2017   Procedure: ESOPHAGOGASTRODUODENOSCOPY (EGD) WITH PROPOFOL;  Surgeon: Daneil Dolin, MD;  Location: [facility];  Service: Endoscopy;  Laterality: N/A;  8:30AM  . HYSTERECTOMY ABDOMINAL WITH SALPINGECTOMY    . KNEE SURGERY Left   . MALONEY DILATION N/A 03/10/2017   Procedure: Venia Minks DILATION;  Surgeon: Daneil Dolin, MD;  Location: [facility];  Service: Endoscopy;  Laterality: N/A;  . NASAL SEPTUM SURGERY    . TUBAL LIGATION     Family History:  Family History  Problem Relation Age of Onset  . Heart failure Mother   . Heart disease Father   . Cancer Father        Lung  . Multiple sclerosis Daughter   . Diabetes Paternal Aunt   . Heart disease Maternal Grandmother   . Heart disease Maternal Grandfather   . Diabetes Paternal Grandmother   . Heart disease Paternal Grandmother   . Heart disease Paternal Grandfather   . Colon cancer Neg Hx   . Colon polyps Neg Hx    Family Psychiatric  History: Patient says 2 of her children have depression.  Does not know of any other family history Tobacco Screening: Have you used any form of tobacco in the last 30 days? (Cigarettes, Smokeless Tobacco, Cigars, and/or Pipes): Patient Refused Screening Social History:  Social History   Substance and Sexual Activity  Alcohol Use No     Social History   Substance and Sexual Activity  Drug Use No    Additional Social History:                           Allergies:   Allergies  Allergen Reactions  . Excedrin Extra Strength [Asa-Apap-Caff Buffered] Diarrhea and Nausea Only  . Statins Other (See Comments)    Hair Loss  . Wellbutrin [Bupropion] Hives   Lab Results:  Results for orders placed or performed during the hospital encounter of 07/24/19 (from the past 48 hour(s))  Glucose, capillary     Status: Abnormal   Collection Time: 07/24/19  5:35 PM  Result Value Ref Range   Glucose-Capillary 138 (H) 70 - 99 mg/dL    Comment: Glucose reference range  applies only to samples taken after fasting for at least 8 hours.   Comment 1 Notify RN   Glucose, capillary     Status: Abnormal   Collection Time: 07/24/19  8:51 PM  Result Value Ref Range   Glucose-Capillary 211 (H) 70 - 99 mg/dL    Comment: Glucose reference range applies only to samples taken after fasting for at least 8 hours.   Comment 1 Notify RN   Glucose, capillary     Status: Abnormal   Collection Time: 07/25/19  6:59 AM  Result Value Ref Range   Glucose-Capillary 144 (H) 70 - 99 mg/dL    Comment: Glucose reference range applies only to samples taken after fasting for at least 8 hours.  Glucose, capillary     Status: Abnormal   Collection Time: 07/25/19 11:48 AM  Result Value Ref Range   Glucose-Capillary 204 (H) 70 - 99 mg/dL    Comment: Glucose reference  range applies only to samples taken after fasting for at least 8 hours.    Blood Alcohol level:  Lab Results  Component Value Date   ETH <10 07/21/2019    Metabolic Disorder Labs:  No results found for: HGBA1C, MPG No results found for: PROLACTIN No results found for: CHOL, TRIG, HDL, CHOLHDL, VLDL, LDLCALC  Current Medications: Current Facility-Administered Medications  Medication Dose Route Frequency Provider Last Rate Last Admin  . acetaminophen (TYLENOL) tablet 650 mg  650 mg Oral Q6H PRN Bayleigh Loflin, Jackquline Denmark, MD   650 mg at 07/25/19 1118  . alum & mag hydroxide-simeth (MAALOX/MYLANTA) 200-200-20 MG/5ML suspension 30 mL  30 mL Oral Q4H PRN Shields Pautz, Jackquline Denmark, MD      . Melene Muller ON 07/26/2019] aspirin EC tablet 81 mg  81 mg Oral QHS Terron Merfeld T, MD      . citalopram (CELEXA) tablet 20 mg  20 mg Oral QHS Keino Placencia T, MD      . docusate sodium (COLACE) capsule 100 mg  100 mg Oral Daily Nishaan Stanke, Jackquline Denmark, MD   100 mg at 07/25/19 0742  . DULoxetine (CYMBALTA) DR capsule 30 mg  30 mg Oral QHS Elowen Debruyn T, MD      . famotidine (PEPCID) tablet 20 mg  20 mg Oral BID Loralai Eisman, Jackquline Denmark, MD   20 mg at 07/25/19 2376  .  furosemide (LASIX) tablet 10 mg  10 mg Oral Daily Kauan Kloosterman, Jackquline Denmark, MD   10 mg at 07/25/19 1513  . insulin aspart (novoLOG) injection 0-15 Units  0-15 Units Subcutaneous TID WC Kayah Hecker, Jackquline Denmark, MD   5 Units at 07/25/19 1150  . insulin aspart (novoLOG) injection 0-5 Units  0-5 Units Subcutaneous QHS Su Duma, Jackquline Denmark, MD   2 Units at 07/24/19 2137  . insulin glargine (LANTUS) injection 35 Units  35 Units Subcutaneous BID Madgie Dhaliwal, Jackquline Denmark, MD   35 Units at 07/25/19 [phone]  . loratadine (CLARITIN) tablet 10 mg  10 mg Oral Daily Regenia Erck, Jackquline Denmark, MD   10 mg at 07/25/19 1513  . LORazepam (ATIVAN) tablet 2 mg  2 mg Oral QHS Eddith Mentor T, MD      . magnesium hydroxide (MILK OF MAGNESIA) suspension 30 mL  30 mL Oral Daily PRN Stafford Riviera, Jackquline Denmark, MD      . Melene Muller ON 07/26/2019] metFORMIN (GLUCOPHAGE-XR) 24 hr tablet 2,000 mg  2,000 mg Oral Q breakfast Rihana Kiddy T, MD      . nicotine (NICODERM CQ - dosed in mg/24 hours) patch 21 mg  21 mg Transdermal Daily Millette Halberstam T, MD      . pravastatin (PRAVACHOL) tablet 10 mg  10 mg Oral q1800 Beena Catano, Jackquline Denmark, MD   10 mg at 07/24/19 1806   PTA Medications: Medications Prior to Admission  Medication Sig Dispense Refill Last Dose  . aspirin EC 81 MG tablet Take 81 mg by mouth daily.     . citalopram (CELEXA) 40 MG tablet Take 40 mg by mouth at bedtime.      . diclofenac sodium (VOLTAREN) 1 % GEL Apply 4 g topically 4 (four) times daily. (Patient not taking: Reported on 07/21/2019) 4 g 5   . docusate sodium (COLACE) 100 MG capsule Take 100 mg by mouth daily.     . fluconazole (DIFLUCAN) 100 MG tablet Take 1 tablet (100 mg total) by mouth daily. (Patient not taking: Reported on 08/22/2018) 14 tablet 0   . furosemide (LASIX) 20 MG tablet  Take 10 mg by mouth daily.      . hydroxypropyl methylcellulose / hypromellose (ISOPTO TEARS / GONIOVISC) 2.5 % ophthalmic solution Place 1 drop into both eyes 3 (three) times daily as needed for dry eyes.     Marland Kitchen ibuprofen (ADVIL) 200 MG  tablet Take 800 mg by mouth every 6 (six) hours as needed for headache or moderate pain.     . Insulin Degludec (TRESIBA) 100 UNIT/ML SOLN Inject 35 Units into the skin 2 (two) times daily.      Marland Kitchen levocetirizine (XYZAL) 5 MG tablet Take 5 mg by mouth in the morning.      . liraglutide (VICTOZA) 18 MG/3ML SOPN Inject 1.2 mg into the skin daily.     Marland Kitchen LORazepam (ATIVAN) 1 MG tablet Take 2 mg by mouth at bedtime.      . lovastatin (MEVACOR) 10 MG tablet Take 10 mg by mouth at bedtime.     . metFORMIN (GLUCOPHAGE-XR) 500 MG 24 hr tablet Take 2,000 mg by mouth daily.     . methylphenidate (RITALIN) 10 MG tablet Take 10 mg by mouth daily.      . Multiple Vitamin (MULTIVITAMIN WITH MINERALS) TABS tablet Take 1 tablet by mouth daily.     Marland Kitchen omeprazole (PRILOSEC) 40 MG capsule Take 40 mg by mouth daily.     . pregabalin (LYRICA) 75 MG capsule Take 75 mg by mouth in the morning.        Musculoskeletal: Strength & Muscle Tone: within normal limits Gait & Station: normal Patient leans: N/A  Psychiatric Specialty Exam: Physical Exam  Nursing note and vitals reviewed. Constitutional: She appears well-developed.  HENT:  Head: Normocephalic and atraumatic.  Eyes: Pupils are equal, round, and reactive to light. Conjunctivae are normal.  Cardiovascular: Normal heart sounds.  Respiratory: Effort normal.  GI: Soft.  Musculoskeletal:        General: Normal range of motion.     Cervical back: Normal range of motion.  Neurological: She is alert.  Skin: Skin is warm and dry.  Psychiatric: Her speech is delayed. She is slowed. She expresses impulsivity. She exhibits a depressed mood. She expresses suicidal ideation. She expresses suicidal plans.    Review of Systems  Constitutional: Negative.   HENT: Negative.   Eyes: Negative.   Respiratory: Negative.   Cardiovascular: Negative.   Gastrointestinal: Negative.   Musculoskeletal: Negative.   Skin: Negative.   Neurological: Negative.    Psychiatric/Behavioral: Positive for dysphoric mood and suicidal ideas. The patient is nervous/anxious.     Blood pressure 138/77, pulse 85, temperature 97.9 F (36.6 C), temperature source Oral, resp. rate 17, height 5' 4.5" (1.638 m), weight 98.4 kg, SpO2 95 %.Body mass index is 36.67 kg/m.  General Appearance: Casual  Eye Contact:  Good  Speech:  Clear and Coherent  Volume:  Normal  Mood:  Depressed  Affect:  Congruent  Thought Process:  Coherent  Orientation:  Full (Time, Place, and Person)  Thought Content:  Logical  Suicidal Thoughts:  Yes.  with intent/plan  Homicidal Thoughts:  No  Memory:  Immediate;   Fair Recent;   Fair Remote;   Fair  Judgement:  Fair  Insight:  Fair  Psychomotor Activity:  Decreased  Concentration:  Concentration: Fair  Recall:  [employer] of Knowledge:  Fair  Language:  Fair  Akathisia:  No  Handed:  Right  AIMS (if indicated):     Assets:  Desire for Improvement Housing Resilience  ADL's:  Impaired  Cognition:  Impaired,  Mild  Sleep:  Number of Hours: 7.75    Treatment Plan Summary: Plan Talked with the patient about medication changes.  Discussed multiple things that can be done to try and improve treatment of depression.  I propose trying to switch her over from Celexa to Cymbalta.  We will start doing a cross titration today.  Otherwise continue her usual outpatient medicine which she knows in detail.  Include in individual and group therapy.  Patient lives with her oldest daughter and her daughter's family and plans to return there at discharge.  Observation Level/Precautions:  15 minute checks  Laboratory:  Chemistry Profile  Psychotherapy:    Medications:    Consultations:    Discharge Concerns:    Estimated LOS:  Other:     Physician Treatment Plan for Primary Diagnosis: Severe recurrent major depression without psychotic features (HCC) Long Term Goal(s): Improvement in symptoms so as ready for discharge  Short Term Goals:  Ability to verbalize feelings will improve, Ability to disclose and discuss suicidal ideas and Ability to demonstrate self-control will improve  Physician Treatment Plan for Secondary Diagnosis: Principal Problem:   Severe recurrent major depression without psychotic features (HCC) Active Problems:   GERD (gastroesophageal reflux disease)   Diabetes (HCC)  Long Term Goal(s): Improvement in symptoms so as ready for discharge  Short Term Goals: Ability to maintain clinical measurements within normal limits will improve and Compliance with prescribed medications will improve  I certify that inpatient services furnished can reasonably be expected to improve the patient's condition.    Mordecai RasmussenJohn Bridgette Wolden, MD 6/16/20213:45 PM

## 2019-07-25 NOTE — Progress Notes (Signed)
Patient pleasant during assessment but presents with flat affect. Patient denies SI/HI/AVH but endorses anxiety and depression. Patient compliant with medication administration per MD orders. Pt observed interacting appropriately with staff and peers on the unit. 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 Patient given education, support and encouragement to be active in her treatment plan. Patient being monitored Q 15 minutes for safety per unit protocol. Patient remains safe on the unit.

## 2019-07-25 NOTE — Plan of Care (Signed)
Patient new to the unit today, hasn't had time to progress  Problem: Education: Goal: Knowledge of General Education information will improve Description: Including pain rating scale, medication(s)/side effects and non-pharmacologic comfort measures Outcome: Not Progressing   Problem: Health Behavior/Discharge Planning: Goal: Ability to manage health-related needs will improve Outcome: Not Progressing   Problem: Clinical Measurements: Goal: Ability to maintain clinical measurements within normal limits will improve Outcome: Not Progressing Goal: Will remain free from infection Outcome: Not Progressing Goal: Diagnostic test results will improve Outcome: Not Progressing Goal: Respiratory complications will improve Outcome: Not Progressing Goal: Cardiovascular complication will be avoided Outcome: Not Progressing   

## 2019-07-25 NOTE — Plan of Care (Signed)
D: Pt alert and oriented x 4. Pt rates depression 5/10, hopelessness 5/10, and anxiety 5/10.Pt goal: "getting diagnosed and on correct medication." Pt reports energy level as normal and concentration as being good. Pt reports sleep last night as being good. Pt did not receive medications for sleep. Pt denies experiencing any pain at this time. Pt denies experiencing any SI/HI, or AVH at this time.   A: Scheduled medications administered to pt, per MD orders. Support and encouragement provided. Frequent verbal contact made. Routine safety checks conducted q15 minutes.   R: No adverse drug reactions noted. Pt verbally contracts for safety at this time. Pt complaint with medications and treatment plan. Pt interacts well with others on the unit. Pt remains safe at this time. Will continue to monitor.   Problem: Education: Goal: Knowledge of General Education information will improve Description: Including pain rating scale, medication(s)/side effects and non-pharmacologic comfort measures Outcome: Progressing   Problem: Health Behavior/Discharge Planning: Goal: Ability to manage health-related needs will improve Outcome: Progressing   Problem: Clinical Measurements: Goal: Ability to maintain clinical measurements within normal limits will improve Outcome: Progressing Goal: Will remain free from infection Outcome: Progressing Goal: Respiratory complications will improve Outcome: Progressing Goal: Cardiovascular complication will be avoided Outcome: Progressing   Problem: Activity: Goal: Risk for activity intolerance will decrease Outcome: Progressing   Problem: Nutrition: Goal: Adequate nutrition will be maintained Outcome: Progressing   Problem: Elimination: Goal: Will not experience complications related to bowel motility Outcome: Progressing Goal: Will not experience complications related to urinary retention Outcome: Progressing   Problem: Pain Managment: Goal: General  experience of comfort will improve Outcome: Progressing   Problem: Safety: Goal: Ability to remain free from injury will improve Outcome: Progressing   Problem: Skin Integrity: Goal: Risk for impaired skin integrity will decrease Outcome: Progressing   Problem: Self-Concept: Goal: Ability to identify factors that promote anxiety will improve Outcome: Progressing Goal: Ability to modify response to factors that promote anxiety will improve Outcome: Progressing   Problem: Coping: Goal: Will verbalize feelings Outcome: Progressing   Problem: Health Behavior/Discharge Planning: Goal: Compliance with therapeutic regimen will improve Outcome: Progressing   Problem: Coping: Goal: Level of anxiety will decrease Outcome: Not Progressing   Problem: Education: Goal: Ability to state activities that reduce stress will improve Outcome: Not Progressing   Problem: Coping: Goal: Ability to identify and develop effective coping behavior will improve Outcome: Not Progressing   Problem: Self-Concept: Goal: Level of anxiety will decrease Outcome: Not Progressing   Problem: Safety: Goal: Ability to identify and utilize support systems that promote safety will improve Outcome: Not Progressing

## 2019-07-26 LAB — GLUCOSE, CAPILLARY
Glucose-Capillary: 191 mg/dL — ABNORMAL HIGH (ref 70–99)
Glucose-Capillary: 223 mg/dL — ABNORMAL HIGH (ref 70–99)
Glucose-Capillary: 278 mg/dL — ABNORMAL HIGH (ref 70–99)
Glucose-Capillary: 242 mg/dL — ABNORMAL HIGH (ref 70–99)

## 2019-07-26 MED ORDER — DULOXETINE HCL 30 MG PO CPEP
60.0000 mg | ORAL_CAPSULE | Freq: Every day | ORAL | Status: DC
  Administered 2019-07-26: 60 mg via ORAL
  Filled 2019-07-26: qty 2
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
## 2019-07-26 NOTE — Plan of Care (Signed)
D: Pt. During assessments this morning is observed up at the medication room for scheduled medicines. Pt. During our interaction is pleasant and cooperative, engagement is very good, forwards appropriately and actively. Pt. Denied si/hi/avh, and verbalized ability to continue to remain safe on the unit. Pt. Denied physical pain and verbalized toleration thus far of her medicines (no abnormalities verbalized). Pt. Endorsed her mood worse today in the earlier morning, but after we talked about positive perspectives, the patient appeared brighter in her affect, and more improved in her mood. Pt. Had some questions regarding follow-up care after discharge, that this writer and the patient discussed as well, that the patient verbalized understanding.   A: Q x 15 minute observation checks in place/maintained for safety. Patient is provided with education throughout shift when appropriate and able.  Patient is given/offered medications per orders. Patient is encouraged to attend groups, participate in unit activities and continue with plan of care. Pt. Chart and plans of care reviewed. Pt. Given support and encouragement when appropriate and able.    R: Patient is complaint with medication and unit procedures thus far. Pt. Observed eating good, observed up for breakfast. Pt. Observed up for groups, participation is good. Pt. Thus far has not been a behavioral concern.      Problem: Education: Goal: Knowledge of General Education information will improve Description: Including pain rating scale, medication(s)/side effects and non-pharmacologic comfort measures Outcome: Progressing   Problem: Health Behavior/Discharge Planning: Goal: Ability to manage health-related needs will improve Outcome: Progressing   Problem: Clinical Measurements: Goal: Ability to maintain clinical measurements within normal limits will improve Outcome: Progressing Goal: Will remain free from infection Outcome: Progressing Goal:  Diagnostic test results will improve Outcome: Progressing Goal: Respiratory complications will improve Outcome: Progressing Goal: Cardiovascular complication will be avoided Outcome: Progressing   Problem: Activity: Goal: Risk for activity intolerance will decrease Outcome: Progressing   Problem: Nutrition: Goal: Adequate nutrition will be maintained Outcome: Progressing   Problem: Coping: Goal: Level of anxiety will decrease Outcome: Progressing   Problem: Elimination: Goal: Will not experience complications related to bowel motility Outcome: Progressing Goal: Will not experience complications related to urinary retention Outcome: Progressing   Problem: Pain Managment: Goal: General experience of comfort will improve Outcome: Progressing   Problem: Safety: Goal: Ability to remain free from injury will improve Outcome: Progressing   Problem: Skin Integrity: Goal: Risk for impaired skin integrity will decrease Outcome: Progressing   Problem: Education: Goal: Ability to state activities that reduce stress will improve Outcome: Progressing   Problem: Coping: Goal: Ability to identify and develop effective coping behavior will improve Outcome: Progressing   Problem: Self-Concept: Goal: Ability to identify factors that promote anxiety will improve Outcome: Progressing Goal: Level of anxiety will decrease Outcome: Progressing Goal: Ability to modify response to factors that promote anxiety will improve Outcome: Progressing   Problem: Education: Goal: Utilization of techniques to improve thought processes will improve Outcome: Progressing Goal: Knowledge of the prescribed therapeutic regimen will improve Outcome: Progressing   Problem: Activity: Goal: Interest or engagement in leisure activities will improve Outcome: Progressing Goal: Imbalance in normal sleep/wake cycle will improve Outcome: Progressing   Problem: Coping: Goal: Coping ability will  improve Outcome: Progressing Goal: Will verbalize feelings Outcome: Progressing   Problem: Health Behavior/Discharge Planning: Goal: Ability to make decisions will improve Outcome: Progressing Goal: Compliance with therapeutic regimen will improve Outcome: Progressing   Problem: Role Relationship: Goal: Will demonstrate positive changes in social behaviors and relationships Outcome: Progressing  Problem: Safety: Goal: Ability to disclose and discuss suicidal ideas will improve Outcome: Progressing Goal: Ability to identify and utilize support systems that promote safety will improve Outcome: Progressing   Problem: Self-Concept: Goal: Will verbalize positive feelings about self Outcome: Progressing Goal: Level of anxiety will decrease Outcome: Progressing   

## 2019-07-26 NOTE — Progress Notes (Signed)
BRIEF PHARMACY NOTE   This patient attended and participated in Medication Management Group counseling led by [facility] staff pharmacist.  This interactive class reviews basic information about prescription medications and education on personal responsibility in medication management.  The class also includes general knowledge of 3 main classes of behavioral medications, including antipsychotics, antidepressants, and mood stabilizers.     Patient behavior was appropriate for group setting.   Educational materials sourced from:  "Medication Do's and Don'ts" from [email or web address]   "Mental Health Medications" from [facility] of Mental Health FaxRack.tn.shtml#part 546503    Albina Billet, PharmD, BCPS Clinical Pharmacist 07/26/2019 2:49 PM

## 2019-07-26 NOTE — [facility] Group Notes (Signed)
Balance In Life 07/26/2019 9:30AM/1PM  Type of Therapy/Topic:  Group Therapy:  Balance in Life  Participation Level:  Active  Description of Group:   This group will address the concept of balance and how it feels and looks when one is unbalanced. Patients will be encouraged to process areas in their lives that are out of balance and identify reasons for remaining unbalanced. Facilitators will guide patients in utilizing problem-solving interventions to address and correct the stressor making their life unbalanced. Understanding and applying boundaries will be explored and addressed for obtaining and maintaining a balanced life. Patients will be encouraged to explore ways to assertively make their unbalanced needs known to significant others in their lives, using other group members and facilitator for support and feedback.  Therapeutic Goals: 1. Patient will identify two or more emotions or situations they have that consume much of in their lives. 2. Patient will identify signs/triggers that life has become out of balance:  3. Patient will identify two ways to set boundaries in order to achieve balance in their lives:  4. Patient will demonstrate ability to communicate their needs through discussion and/or role plays  Summary of Patient Progress: Actively and appropriately engaged in the group. Patient was able to provide support and validation to other group members.patient states she plans to have a meeting with her family when she returns home to to convey to them her feelings and needs. Patient says she will work on improving communication with her family. Patient displayed understanding of subject matter and respected boundaries during session.   Therapeutic Modalities:   Cognitive Behavioral Therapy Solution-Focused Therapy Assertiveness Training  Enedina Pair Philip Aspen, LCSW

## 2019-07-26 NOTE — Progress Notes (Signed)
Patient pleasant and cooperative. Denies any SI, HI, AVH. Medication compliant. Appropriate with staff and peers. Patient voiced no concerns this evening. Slept well throughout the night. Encouragement and support provided, safety checks maintained. Medications given as prescribed. Pt receptive and remains safe on unit with q 15 min checks.

## 2019-07-26 NOTE — [facility] Suicide Risk Assessment (Signed)
[facility] INPATIENT:  Family/Significant Other Suicide Prevention Education  Suicide Prevention Education:  Education Completed; Maria Williamson, daughter, [phone], has been identified by the patient as the family member/significant other with whom the patient will be residing, and identified as the person(s) who will aid the patient in the event of a mental health crisis (suicidal ideations/suicide attempt).  With written consent from the patient, the family member/significant other has been provided the following suicide prevention education, prior to the and/or following the discharge of the patient.  The suicide prevention education provided includes the following:  Suicide risk factors  Suicide prevention and interventions  National Suicide Hotline telephone number  [facility] assessment telephone number  [facility] Emergency Assistance 911  [facility] and/or Residential Mobile Crisis Unit telephone number  Request made of family/significant other to:  Remove weapons (e.g., guns, rifles, knives), all items previously/currently identified as safety concern.    Remove drugs/medications (over-the-counter, prescriptions, illicit drugs), all items previously/currently identified as a safety concern.  The family member/significant other verbalizes understanding of the suicide prevention education information provided.  The family member/significant other agrees to remove the items of safety concern listed above.  Daughter reports that the patient "has been in and out of hospitals".  She reports "she would go through stages where she was in obvious mental stress".  Daughter reports "She would go to the hospital and then convince them that she could go home and then get out and start talking trash and the cycle would be repeated, we're hoping that this time would be different."  She reports "the last incident occurred with my grandson".  Daughter reports that the patient was  "bullying" her great-grandson.  Daughter reports that because of the pt's behaviors with the great-grandson, she is no longer allowed to be around the [place].  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  She reports patient does not have access to weapons.  She reports that "yes and no" pt could be a danger to self and others.     Harden Mo 07/26/2019, 12:05 PM

## 2019-07-26 NOTE — Plan of Care (Signed)
  Problem: Clinical Measurements: Goal: Diagnostic test results will improve Outcome: Progressing   Problem: Nutrition: Goal: Adequate nutrition will be maintained Outcome: Progressing   Problem: Safety: Goal: Ability to remain free from injury will improve Outcome: Progressing   Problem: Safety: Goal: Ability to disclose and discuss suicidal ideas will improve Outcome: Progressing

## 2019-07-26 NOTE — [facility] Group Notes (Signed)
[facility] Group Notes:  (Nursing/MHT/Case Management/Adjunct)  Date:  07/26/2019  Time:  8:56 PM  Type of Therapy:  Group Therapy  Participation Level:  Active  Participation Quality:  Appropriate  Affect:  Appropriate  Cognitive:  Alert  Insight:  Good  Engagement in Group:  Engaged and she met her goals.  Modes of Intervention:  Support  Summary of Progress/Problems:  Nehemiah Settle 07/26/2019, 8:56 PM

## 2019-07-26 NOTE — Progress Notes (Signed)
Recreation Therapy Notes  Date: 07/26/2019  Time: 9:30 am  Location: Room 21   Behavioral response: Appropriate   Intervention Topic: Animal Assisted Therapy   Discussion/Intervention:  Animal Assisted Therapy took place today during group.  Animal Assisted Therapy is the planned inclusion of an animal in a patients treatment plan. The patients were able to engage in therapy with an animal during group. Participants were educated on what a service dog is and the different between a support dog and a service dog. Patient were informed on the many animal needs there are and how their needs are similar. Individuals were enlightened on the process to get a service animal or support animal. Patients got the opportunity to pet the animal and were offered emotional support from the animal and staff.  Clinical Observations/Feedback:  Patient came to group and was on topic and was focused on what peers and staff had to say. Participant shared their experiences and history with animals. Individual was social with peers, staff and animal while participating in group.  Mertha Clyatt LRT/CTRS         Curby Carswell 07/26/2019 11:13 AM

## 2019-07-26 NOTE — Progress Notes (Signed)
[facility] MD Progress Note  07/26/2019 2:40 PM Maria Williamson  MRN:  [id number] Subjective: Patient seen chart reviewed.  69 year old woman with depression.  Patient also met with treatment team today.  She says that while she is still depressed she is no longer suicidal and no longer in crisis.  Denies having any thought about harming herself.  Patient has tolerated medicine well without any side effects or complaints.  She says she feels that she would be ready to go home as early as tomorrow. Principal Problem: Severe recurrent major depression without psychotic features ([place]) Diagnosis: Principal Problem:   Severe recurrent major depression without psychotic features ([place]) Active Problems:   GERD (gastroesophageal reflux disease)   Diabetes ([place])  Total Time spent with patient: 30 minutes  Past Psychiatric History: Past history of longstanding depression problems  Past Medical History:  Past Medical History:  Diagnosis Date  . Depression   . Diabetes mellitus without complication ([place])   . GERD (gastroesophageal reflux disease)   . Hypercholesterolemia   . Osteoarthritis   . Sleep apnea    cannot tolerate CPAP    Past Surgical History:  Procedure Laterality Date  . BIOPSY  03/10/2017   Procedure: BIOPSY;  Surgeon: Daneil Dolin, MD;  Location: [facility];  Service: Endoscopy;;  gastric  . BREAST BIOPSY     90's  . ESOPHAGOGASTRODUODENOSCOPY (EGD) WITH PROPOFOL N/A 03/10/2017   Procedure: ESOPHAGOGASTRODUODENOSCOPY (EGD) WITH PROPOFOL;  Surgeon: Daneil Dolin, MD;  Location: [facility];  Service: Endoscopy;  Laterality: N/A;  8:30AM  . HYSTERECTOMY ABDOMINAL WITH SALPINGECTOMY    . KNEE SURGERY Left   . MALONEY DILATION N/A 03/10/2017   Procedure: Venia Minks DILATION;  Surgeon: Daneil Dolin, MD;  Location: [facility];  Service: Endoscopy;  Laterality: N/A;  . NASAL SEPTUM SURGERY    . TUBAL LIGATION     Family History:  Family History  Problem Relation Age of Onset   . Heart failure Mother   . Heart disease Father   . Cancer Father        Lung  . Multiple sclerosis Daughter   . Diabetes Paternal Aunt   . Heart disease Maternal Grandmother   . Heart disease Maternal Grandfather   . Diabetes Paternal Grandmother   . Heart disease Paternal Grandmother   . Heart disease Paternal Grandfather   . Colon cancer Neg Hx   . Colon polyps Neg Hx    Family Psychiatric  History: See previous Social History:  Social History   Substance and Sexual Activity  Alcohol Use No     Social History   Substance and Sexual Activity  Drug Use No    Social History   Socioeconomic History  . Marital status: Divorced    Spouse name: Not on file  . Number of children: Not on file  . Years of education: Not on file  . Highest education level: Not on file  Occupational History  . Occupation: retired    Comment: [employer], worked in a lab   Tobacco Use  . Smoking status: Former Smoker    Packs/day: 1.00    Years: 26.00    Pack years: 26.00    Types: Cigarettes    Quit date: 12/09/2004    Years since quitting: 14.6  . Smokeless tobacco: Never Used  Vaping Use  . Vaping Use: Unknown  Substance and Sexual Activity  . Alcohol use: No  . Drug use: No  . Sexual activity:  Never    Birth control/protection: None  Other Topics Concern  . Not on file  Social History Narrative  . Not on file   Social Determinants of Health   Financial Resource Strain:   . Difficulty of Paying Living Expenses:   Food Insecurity:   . Worried About Charity fundraiser in the Last Year:   . Arboriculturist in the Last Year:   Transportation Needs:   . Film/video editor (Medical):   Marland Kitchen Lack of Transportation (Non-Medical):   Physical Activity:   . Days of Exercise per Week:   . Minutes of Exercise per Session:   Stress:   . Feeling of Stress :   Social Connections:   . Frequency of Communication with Friends and Family:   . Frequency of Social Gatherings with  Friends and Family:   . Attends Religious Services:   . Active Member of Clubs or Organizations:   . Attends Archivist Meetings:   Marland Kitchen Marital Status:    Additional Social History:                         Sleep: Fair  Appetite:  Negative  Current Medications: Current Facility-Administered Medications  Medication Dose Route Frequency Provider Last Rate Last Admin  . acetaminophen (TYLENOL) tablet 650 mg  650 mg Oral Q6H PRN Clapacs, Madie Reno, MD   650 mg at 07/25/19 1118  . alum & mag hydroxide-simeth (MAALOX/MYLANTA) 200-200-20 MG/5ML suspension 30 mL  30 mL Oral Q4H PRN Clapacs, John T, MD      . aspirin EC tablet 81 mg  81 mg Oral QHS Clapacs, John T, MD      . docusate sodium (COLACE) capsule 100 mg  100 mg Oral Daily Clapacs, Madie Reno, MD   100 mg at 07/26/19 0736  . DULoxetine (CYMBALTA) DR capsule 60 mg  60 mg Oral QHS Clapacs, John T, MD      . famotidine (PEPCID) tablet 20 mg  20 mg Oral BID Clapacs, Madie Reno, MD   20 mg at 07/26/19 0737  . furosemide (LASIX) tablet 10 mg  10 mg Oral Daily Clapacs, Madie Reno, MD   10 mg at 07/26/19 0737  . insulin aspart (novoLOG) injection 0-15 Units  0-15 Units Subcutaneous TID WC Clapacs, Madie Reno, MD   5 Units at 07/26/19 1215  . insulin aspart (novoLOG) injection 0-5 Units  0-5 Units Subcutaneous QHS Clapacs, Madie Reno, MD   2 Units at 07/25/19 2145  . insulin glargine (LANTUS) injection 35 Units  35 Units Subcutaneous BID Clapacs, Madie Reno, MD   35 Units at 07/26/19 0908  . loratadine (CLARITIN) tablet 10 mg  10 mg Oral Daily Clapacs, Madie Reno, MD   10 mg at 07/26/19 0736  . LORazepam (ATIVAN) tablet 2 mg  2 mg Oral QHS Clapacs, Madie Reno, MD   2 mg at 07/25/19 2146  . magnesium hydroxide (MILK OF MAGNESIA) suspension 30 mL  30 mL Oral Daily PRN Clapacs, John T, MD      . metFORMIN (GLUCOPHAGE-XR) 24 hr tablet 2,000 mg  2,000 mg Oral Q breakfast Clapacs, John T, MD   2,000 mg at 07/26/19 0736  . nicotine (NICODERM CQ - dosed in mg/24 hours)  patch 21 mg  21 mg Transdermal Daily Clapacs, John T, MD      . pravastatin (PRAVACHOL) tablet 10 mg  10 mg Oral q1800 Clapacs, Madie Reno, MD  10 mg at 07/25/19 1724    Lab Results:  Results for orders placed or performed during the hospital encounter of 07/24/19 (from the past 48 hour(s))  Glucose, capillary     Status: Abnormal   Collection Time: 07/24/19  5:35 PM  Result Value Ref Range   Glucose-Capillary 138 (H) 70 - 99 mg/dL    Comment: Glucose reference range applies only to samples taken after fasting for at least 8 hours.   Comment 1 Notify RN   Glucose, capillary     Status: Abnormal   Collection Time: 07/24/19  8:51 PM  Result Value Ref Range   Glucose-Capillary 211 (H) 70 - 99 mg/dL    Comment: Glucose reference range applies only to samples taken after fasting for at least 8 hours.   Comment 1 Notify RN   Glucose, capillary     Status: Abnormal   Collection Time: 07/25/19  6:59 AM  Result Value Ref Range   Glucose-Capillary 144 (H) 70 - 99 mg/dL    Comment: Glucose reference range applies only to samples taken after fasting for at least 8 hours.  Glucose, capillary     Status: Abnormal   Collection Time: 07/25/19 11:48 AM  Result Value Ref Range   Glucose-Capillary 204 (H) 70 - 99 mg/dL    Comment: Glucose reference range applies only to samples taken after fasting for at least 8 hours.  Glucose, capillary     Status: Abnormal   Collection Time: 07/25/19  4:06 PM  Result Value Ref Range   Glucose-Capillary 194 (H) 70 - 99 mg/dL    Comment: Glucose reference range applies only to samples taken after fasting for at least 8 hours.   Comment 1 Notify RN   Glucose, capillary     Status: Abnormal   Collection Time: 07/25/19  8:21 PM  Result Value Ref Range   Glucose-Capillary 239 (H) 70 - 99 mg/dL    Comment: Glucose reference range applies only to samples taken after fasting for at least 8 hours.  Glucose, capillary     Status: Abnormal   Collection Time: 07/26/19  6:58  AM  Result Value Ref Range   Glucose-Capillary 191 (H) 70 - 99 mg/dL    Comment: Glucose reference range applies only to samples taken after fasting for at least 8 hours.  Glucose, capillary     Status: Abnormal   Collection Time: 07/26/19 11:39 AM  Result Value Ref Range   Glucose-Capillary 223 (H) 70 - 99 mg/dL    Comment: Glucose reference range applies only to samples taken after fasting for at least 8 hours.   Comment 1 Notify RN     Blood Alcohol level:  Lab Results  Component Value Date   ETH <10 32/44/0102    Metabolic Disorder Labs: No results found for: HGBA1C, MPG No results found for: PROLACTIN No results found for: CHOL, TRIG, HDL, CHOLHDL, VLDL, LDLCALC  Physical Findings: AIMS: Facial and Oral Movements Muscles of Facial Expression: None, normal Lips and Perioral Area: None, normal Jaw: None, normal Tongue: None, normal,Extremity Movements Upper (arms, wrists, hands, fingers): None, normal Lower (legs, knees, ankles, toes): None, normal, Trunk Movements Neck, shoulders, hips: None, normal, Overall Severity Severity of abnormal movements (highest score from questions above): None, normal Incapacitation due to abnormal movements: None, normal Patient's awareness of abnormal movements (rate only patient's report): No Awareness, Dental Status Current problems with teeth and/or dentures?: No Does patient usually wear dentures?: No  CIWA:  CIWA-Ar Total: 1  COWS:  COWS Total Score: 1  Musculoskeletal: Strength & Muscle Tone: within normal limits Gait & Station: normal Patient leans: N/A  Psychiatric Specialty Exam: Physical Exam  Nursing note and vitals reviewed. Constitutional: She appears well-developed.  HENT:  Head: Normocephalic and atraumatic.  Eyes: Pupils are equal, round, and reactive to light. Conjunctivae are normal.  Cardiovascular: Normal heart sounds.  Respiratory: Effort normal.  GI: Soft.  Musculoskeletal:        General: Normal range of  motion.     Cervical back: Normal range of motion.  Neurological: She is alert.  Skin: Skin is warm and dry.  Psychiatric: Her speech is normal and behavior is normal. Mood, judgment and thought content normal.    Review of Systems  Constitutional: Negative.   HENT: Negative.   Eyes: Negative.   Respiratory: Negative.   Cardiovascular: Negative.   Gastrointestinal: Negative.   Musculoskeletal: Negative.   Skin: Negative.   Neurological: Negative.   Psychiatric/Behavioral: Positive for dysphoric mood. Negative for suicidal ideas.    Blood pressure (!) 145/83, pulse 86, temperature 97.9 F (36.6 C), temperature source Oral, resp. rate 17, height 5' 4.5" (1.638 m), weight 98.4 kg, SpO2 94 %.Body mass index is 36.67 kg/m.  General Appearance: Casual  Eye Contact:  Good  Speech:  Clear and Coherent  Volume:  Normal  Mood:  Euthymic  Affect:  Congruent  Thought Process:  Goal Directed  Orientation:  Full (Time, Place, and Person)  Thought Content:  Logical  Suicidal Thoughts:  No  Homicidal Thoughts:  No  Memory:  Immediate;   Fair Recent;   Fair Remote;   Fair  Judgement:  Fair  Insight:  Fair  Psychomotor Activity:  Decreased  Concentration:  Concentration: Fair  Recall:  [employer] of Knowledge:  Fair  Language:  Fair  Akathisia:  No  Handed:  Right  AIMS (if indicated):     Assets:  Desire for Improvement Housing Physical Health Resilience Social Support  ADL's:  Intact  Cognition:  WNL  Sleep:  Number of Hours: 6.15     Treatment Plan Summary: Daily contact with patient to assess and evaluate symptoms and progress in treatment, Medication management and Plan Patient seems to be stabilizing.  She inquired about the plan to cross titrate antidepressants.  We will go ahead and increase the Cymbalta to 60 mg tonight while discontinuing the citalopram.  No other change to medicine for now.  Psychoeducation and supportive therapy completed.  We will work on referral  for therapy at discharge.  Likely discharge tomorrow.  Maria Berthold, MD 07/26/2019, 2:40 PM

## 2019-07-26 NOTE — Tx Team (Addendum)
Interdisciplinary Treatment and Diagnostic Plan Update  07/26/2019 Time of Session: 9:00AM NGA RABON MRN: [id number]  Principal Diagnosis: Severe recurrent major depression without psychotic features [facility])  Secondary Diagnoses: Principal Problem:   Severe recurrent major depression without psychotic features ([place]) Active Problems:   GERD (gastroesophageal reflux disease)   Diabetes ([place])   Current Medications:  Current Facility-Administered Medications  Medication Dose Route Frequency Provider Last Rate Last Admin   acetaminophen (TYLENOL) tablet 650 mg  650 mg Oral Q6H PRN Clapacs, John T, MD   650 mg at 07/25/19 1118   alum & mag hydroxide-simeth (MAALOX/MYLANTA) 200-200-20 MG/5ML suspension 30 mL  30 mL Oral Q4H PRN Clapacs, Madie Reno, MD       aspirin EC tablet 81 mg  81 mg Oral QHS Clapacs, John T, MD       citalopram (CELEXA) tablet 20 mg  20 mg Oral QHS Clapacs, John T, MD   20 mg at 07/25/19 2146   docusate sodium (COLACE) capsule 100 mg  100 mg Oral Daily Clapacs, John T, MD   100 mg at 07/26/19 0736   DULoxetine (CYMBALTA) DR capsule 30 mg  30 mg Oral QHS Clapacs, John T, MD   30 mg at 07/25/19 2146   famotidine (PEPCID) tablet 20 mg  20 mg Oral BID Clapacs, John T, MD   20 mg at 07/26/19 0737   furosemide (LASIX) tablet 10 mg  10 mg Oral Daily Clapacs, John T, MD   10 mg at 07/26/19 0737   insulin aspart (novoLOG) injection 0-15 Units  0-15 Units Subcutaneous TID WC Clapacs, Madie Reno, MD   3 Units at 07/26/19 0739   insulin aspart (novoLOG) injection 0-5 Units  0-5 Units Subcutaneous QHS Clapacs, Madie Reno, MD   2 Units at 07/25/19 2145   insulin glargine (LANTUS) injection 35 Units  35 Units Subcutaneous BID Clapacs, Madie Reno, MD   35 Units at 07/26/19 0908   loratadine (CLARITIN) tablet 10 mg  10 mg Oral Daily Clapacs, Madie Reno, MD   10 mg at 07/26/19 0736   LORazepam (ATIVAN) tablet 2 mg  2 mg Oral QHS Clapacs, John T, MD   2 mg at 07/25/19 2146   magnesium  hydroxide (MILK OF MAGNESIA) suspension 30 mL  30 mL Oral Daily PRN Clapacs, John T, MD       metFORMIN (GLUCOPHAGE-XR) 24 hr tablet 2,000 mg  2,000 mg Oral Q breakfast Clapacs, John T, MD   2,000 mg at 07/26/19 0736   nicotine (NICODERM CQ - dosed in mg/24 hours) patch 21 mg  21 mg Transdermal Daily Clapacs, John T, MD       pravastatin (PRAVACHOL) tablet 10 mg  10 mg Oral q1800 Clapacs, John T, MD   10 mg at 07/25/19 1724   PTA Medications: Medications Prior to Admission  Medication Sig Dispense Refill Last Dose   aspirin EC 81 MG tablet Take 81 mg by mouth daily.      citalopram (CELEXA) 40 MG tablet Take 40 mg by mouth at bedtime.       diclofenac sodium (VOLTAREN) 1 % GEL Apply 4 g topically 4 (four) times daily. (Patient not taking: Reported on 07/21/2019) 4 g 5    docusate sodium (COLACE) 100 MG capsule Take 100 mg by mouth daily.      fluconazole (DIFLUCAN) 100 MG tablet Take 1 tablet (100 mg total) by mouth daily. (Patient not taking: Reported on 08/22/2018) 14 tablet 0    furosemide (  LASIX) 20 MG tablet Take 10 mg by mouth daily.       hydroxypropyl methylcellulose / hypromellose (ISOPTO TEARS / GONIOVISC) 2.5 % ophthalmic solution Place 1 drop into both eyes 3 (three) times daily as needed for dry eyes.      ibuprofen (ADVIL) 200 MG tablet Take 800 mg by mouth every 6 (six) hours as needed for headache or moderate pain.      Insulin Degludec (TRESIBA) 100 UNIT/ML SOLN Inject 35 Units into the skin 2 (two) times daily.       levocetirizine (XYZAL) 5 MG tablet Take 5 mg by mouth in the morning.       liraglutide (VICTOZA) 18 MG/3ML SOPN Inject 1.2 mg into the skin daily.      LORazepam (ATIVAN) 1 MG tablet Take 2 mg by mouth at bedtime.       lovastatin (MEVACOR) 10 MG tablet Take 10 mg by mouth at bedtime.      metFORMIN (GLUCOPHAGE-XR) 500 MG 24 hr tablet Take 2,000 mg by mouth daily.      methylphenidate (RITALIN) 10 MG tablet Take 10 mg by mouth daily.        Multiple Vitamin (MULTIVITAMIN WITH MINERALS) TABS tablet Take 1 tablet by mouth daily.      omeprazole (PRILOSEC) 40 MG capsule Take 40 mg by mouth daily.      pregabalin (LYRICA) 75 MG capsule Take 75 mg by mouth in the morning.        Patient Stressors: Loss of relationship with daughter Other: Death of family and pet dog  Patient Strengths: Ability for insight Motivation for treatment/growth  Treatment Modalities: Medication Management, Group therapy, Case management,  1 to 1 session with clinician, Psychoeducation, Recreational therapy.   Physician Treatment Plan for Primary Diagnosis: Severe recurrent major depression without psychotic features (HCC) Long Term Goal(s): Improvement in symptoms so as ready for discharge Improvement in symptoms so as ready for discharge   Short Term Goals: Ability to verbalize feelings will improve Ability to disclose and discuss suicidal ideas Ability to demonstrate self-control will improve Ability to maintain clinical measurements within normal limits will improve Compliance with prescribed medications will improve  Medication Management: Evaluate patient's response, side effects, and tolerance of medication regimen.  Therapeutic Interventions: 1 to 1 sessions, Unit Group sessions and Medication administration.  Evaluation of Outcomes: Progressing  Physician Treatment Plan for Secondary Diagnosis: Principal Problem:   Severe recurrent major depression without psychotic features (HCC) Active Problems:   GERD (gastroesophageal reflux disease)   Diabetes (HCC)  Long Term Goal(s): Improvement in symptoms so as ready for discharge Improvement in symptoms so as ready for discharge   Short Term Goals: Ability to verbalize feelings will improve Ability to disclose and discuss suicidal ideas Ability to demonstrate self-control will improve Ability to maintain clinical measurements within normal limits will improve Compliance with prescribed  medications will improve     Medication Management: Evaluate patient's response, side effects, and tolerance of medication regimen.  Therapeutic Interventions: 1 to 1 sessions, Unit Group sessions and Medication administration.  Evaluation of Outcomes: Progressing   RN Treatment Plan for Primary Diagnosis: Severe recurrent major depression without psychotic features (HCC) Long Term Goal(s): Knowledge of disease and therapeutic regimen to maintain health will improve  Short Term Goals: Ability to demonstrate self-control, Ability to participate in decision making will improve, Ability to verbalize feelings will improve, Ability to disclose and discuss suicidal ideas and Ability to identify and develop effective coping behaviors will  improve  Medication Management: RN will administer medications as ordered by provider, will assess and evaluate patient's response and provide education to patient for prescribed medication. RN will report any adverse and/or side effects to prescribing provider.  Therapeutic Interventions: 1 on 1 counseling sessions, Psychoeducation, Medication administration, Evaluate responses to treatment, Monitor vital signs and CBGs as ordered, Perform/monitor CIWA, COWS, AIMS and Fall Risk screenings as ordered, Perform wound care treatments as ordered.  Evaluation of Outcomes: Progressing   LCSW Treatment Plan for Primary Diagnosis: Severe recurrent major depression without psychotic features (HCC) Long Term Goal(s): Safe transition to appropriate next level of care at discharge, Engage patient in therapeutic group addressing interpersonal concerns.  Short Term Goals: Engage patient in aftercare planning with referrals and resources, Increase social support, Increase ability to appropriately verbalize feelings, Increase emotional regulation and Increase skills for wellness and recovery  Therapeutic Interventions: Assess for all discharge needs, 1 to 1 time with Social  worker, Explore available resources and support systems, Assess for adequacy in community support network, Educate family and significant other(s) on suicide prevention, Complete Psychosocial Assessment, Interpersonal group therapy.  Evaluation of Outcomes: Progressing   Progress in Treatment: Attending groups: Yes. Participating in groups: Yes. Taking medication as prescribed: Yes. Toleration medication: Yes. Family/Significant other contact made: No, will contact:  once permission is given. Patient understands diagnosis: Yes. Discussing patient identified problems/goals with staff: Yes. Medical problems stabilized or resolved: Yes. Denies suicidal/homicidal ideation: Yes. Issues/concerns per patient self-inventory: No. Other: none  New problem(s) identified: No, Describe:  none  New Short Term/Long Term Goal(s): medication management for mood stabilization; elimination of SI thoughts; development of comprehensive mental wellness/sobriety plan.  Patient Goals:  "correct diagnosis and eve out my medication"  Discharge Plan or Barriers: Patient reports plans to return to the home with her daughter at discharge.  Patient is open to an aftercare referral.  Patient is looking for senior support groups/activities in her home community and CSW will assist.  Reason for Continuation of Hospitalization: Anxiety Depression Medication stabilization  Estimated Length of Stay:  1-7 days  Recreational Therapy: Patient: N/A Patient Goal: Patient will engage in groups without prompting or encouragement from LRT x3 group sessions within 5 recreation therapy group sessions.  Attendees: Patient: Maria Williamson 07/26/2019 9:42 AM  Physician: Dr. Toni Amend, MD 07/26/2019 9:42 AM  Nursing: Lorenda Hatchet RN 07/26/2019 9:42 AM  RN Care Manager: 07/26/2019 9:42 AM  Social Worker: Penni Homans, LCSW 07/26/2019 9:42 AM  Recreational Therapist: Garret Reddish, Drue Flirt, LRT 07/26/2019 9:42 AM  Other: Lowella Dandy, LCSW 07/26/2019 9:42 AM  Other:  07/26/2019 9:42 AM  Other: 07/26/2019 9:42 AM    Scribe for Treatment Team: Harden Mo, LCSW 07/26/2019 9:42 AM

## 2019-07-27 LAB — GLUCOSE, CAPILLARY: Glucose-Capillary: 164 mg/dL — ABNORMAL HIGH (ref 70–99)

## 2019-07-27 MED ORDER — METFORMIN HCL ER (OSM) 1000 MG PO TB24: 2000.0000 mg | ORAL_TABLET | Freq: Every day | ORAL | 1 refills | Status: AC

## 2019-07-27 MED ORDER — DULOXETINE HCL 60 MG PO CPEP: 60.0000 mg | ORAL_CAPSULE | Freq: Every day | ORAL | 1 refills | Status: AC

## 2019-07-27 MED ORDER — FAMOTIDINE 20 MG PO TABS: 20.0000 mg | ORAL_TABLET | Freq: Two times a day (BID) | ORAL | 1 refills | Status: AC

## 2019-07-27 MED ORDER — PRAVASTATIN SODIUM 10 MG PO TABS: 10.0000 mg | ORAL_TABLET | Freq: Every day | ORAL | 1 refills | Status: AC

## 2019-07-27 MED ORDER — FUROSEMIDE 20 MG PO TABS: 10.0000 mg | ORAL_TABLET | Freq: Every day | ORAL | 1 refills | Status: AC

## 2019-07-27 MED ORDER — LORATADINE 10 MG PO TABS: 10.0000 mg | ORAL_TABLET | Freq: Every day | ORAL | 1 refills | Status: AC

## 2019-07-27 MED ORDER — ASPIRIN 81 MG PO TBEC: 81.0000 mg | DELAYED_RELEASE_TABLET | Freq: Every day | ORAL | 1 refills | Status: AC

## 2019-07-27 MED ORDER — DOCUSATE SODIUM 100 MG PO CAPS: 100.0000 mg | ORAL_CAPSULE | Freq: Every day | ORAL | 1 refills | Status: AC

## 2019-07-27 MED ORDER — LORAZEPAM 1 MG PO TABS: 2.0000 mg | ORAL_TABLET | Freq: Every day | ORAL | 1 refills | Status: AC

## 2019-07-27 NOTE — BHH Group Notes (Signed)
LCSW Group Therapy Note  07/27/2019 1:00 PM  Type of Therapy/Topic:  Group Therapy:  Emotion Regulation  Participation Level:  Did Not Attend   Description of Group:   The purpose of this group is to assist patients in learning to regulate negative emotions and experience positive emotions. Patients will be guided to discuss ways in which they have been vulnerable to their negative emotions. These vulnerabilities will be juxtaposed with experiences of positive emotions or situations, and patients will be challenged to use positive emotions to combat negative ones. Special emphasis will be placed on coping with negative emotions in conflict situations, and patients will process healthy conflict resolution skills.  Therapeutic Goals: 1. Patient will identify two positive emotions or experiences to reflect on in order to balance out negative emotions 2. Patient will label two or more emotions that they find the most difficult to experience 3. Patient will demonstrate positive conflict resolution skills through discussion and/or role plays  Summary of Patient Progress: Patient was discharged by time of group.   Therapeutic Modalities:   Cognitive Behavioral Therapy Feelings Identification Dialectical Behavioral Therapy  Penni Homans, MSW, LCSW 07/27/2019 2:26 PM

## 2019-07-27 NOTE — Progress Notes (Signed)
Recreation Therapy Notes   Date: 07/27/2019  Time: 9:30 am  Location: Craft room   Behavioral response: Appropriate  Intervention Topic: Happiness    Discussion/Intervention:  Group content today was focused on Happiness. The group defined happiness and described where happiness comes from. Individuals identified what makes them happy and how they go about making others happy. Patients expressed things that stop them from being happy and ways they can improve their happiness. The group stated reasons why it is important to be happy. The group participated in the intervention "My Happiness", where they had a chance to identify and express things that make them happy. Clinical Observations/Feedback:  Patient came to group and defined happiness as being able to laugh at something. She expressed that happiness comes from inside of yourself. Individual was social with peers and staff while participating on the intervention.  Shandora Koogler LRT/CTRS         Shields Pautz 07/27/2019 11:29 AM

## 2019-07-27 NOTE — BHH Suicide Risk Assessment (Signed)
[facility] Discharge Suicide Risk Assessment   Principal Problem: Severe recurrent major depression without psychotic features [facility]) Discharge Diagnoses: Principal Problem:   Severe recurrent major depression without psychotic features (HCC) Active Problems:   GERD (gastroesophageal reflux disease)   Diabetes (HCC)   Total Time spent with patient: 30 minutes  Musculoskeletal: Strength & Muscle Tone: within normal limits Gait & Station: normal Patient leans: N/A  Psychiatric Specialty Exam: Review of Systems  Constitutional: Negative.   HENT: Negative.   Eyes: Negative.   Respiratory: Negative.   Cardiovascular: Negative.   Gastrointestinal: Negative.   Musculoskeletal: Negative.   Skin: Negative.   Neurological: Negative.   Psychiatric/Behavioral: Negative.     Blood pressure 124/64, pulse 81, temperature 97.8 F (36.6 C), temperature source Oral, resp. rate 17, height 5' 4.5" (1.638 m), weight 98.4 kg, SpO2 94 %.Body mass index is 36.67 kg/m.  General Appearance: Casual  Eye Contact::  Good  Speech:  Clear and Coherent409  Volume:  Normal  Mood:  Euthymic  Affect:  Congruent  Thought Process:  Goal Directed  Orientation:  Full (Time, Place, and Person)  Thought Content:  Logical  Suicidal Thoughts:  No  Homicidal Thoughts:  No  Memory:  Immediate;   Fair Recent;   Fair Remote;   Fair  Judgement:  Fair  Insight:  Fair  Psychomotor Activity:  Normal  Concentration:  Fair  Recall:  [employer] of Knowledge:Fair  Language: Fair  Akathisia:  No  Handed:  Right  AIMS (if indicated):     Assets:  Desire for Improvement Housing Physical Health Resilience  Sleep:  Number of Hours: 6  Cognition: WNL  ADL's:  Intact   Mental Status Per Nursing Assessment::   On Admission:  Self-harm thoughts  Demographic Factors:  Age 24 or older and Caucasian  Loss Factors: Financial problems/change in socioeconomic status  Historical Factors: Impulsivity  Risk Reduction  Factors:   Sense of responsibility to family, Religious beliefs about death, Living with another person, especially a relative, Positive social support and Positive therapeutic relationship  Continued Clinical Symptoms:  Depression:   Impulsivity  Cognitive Features That Contribute To Risk:  None    Suicide Risk:  Minimal: No identifiable suicidal ideation.  Patients presenting with no risk factors but with morbid ruminations; may be classified as minimal risk based on the severity of the depressive symptoms    Plan Of Care/Follow-up recommendations:  Activity:  Activity as tolerated Diet:  Regular diet Other:  Follow-up with outpatient mental health treatment  Mordecai Rasmussen, MD 07/27/2019, 9:18 AM

## 2019-07-27 NOTE — Progress Notes (Signed)
Patient pleasant during assessment but presents with flat affect. Patient denies SI/HI/AVH but endorses anxiety and depression. Patient compliant with medication administration per MD orders. Pt observed interacting appropriately with staff and peers on the unit. Patient stated she was ready to leave tomorrow. Patient given education, support and encouragement to be active in her treatment plan. Patient being monitored Q 15 minutes for safety per unit protocol. Patient remains safe on the unit.

## 2019-07-27 NOTE — Progress Notes (Signed)
°  [facility] Adult Case Management Discharge Plan :  Will you be returning to the same living situation after discharge:  Yes,  pt reports she is returnig home. At discharge, do you have transportation home?: Yes,  pt's daughter will provide transportation. Do you have the ability to pay for your medications: Yes,  Medicare  Release of information consent forms completed and in the chart;  Patient's signature needed at discharge.  Patient to Follow up at:  Follow-up Information    [facility] Follow up.   Why: Appointment is scheduled for 07/31/2019 at 2:30pm. This is a consultation for therapy and psychiatry.  Log into your MyChart, an email is being sent with information.  To adjust appt please call [phone]. Thanks! Contact information: [address] [place],  [place]  [address] Main: [phone]              Next level of care provider has access to [facility] Link:yes  Safety Planning and Suicide Prevention discussed: Yes,  SPE completed with the pt's daughter.  Have you used any form of tobacco in the last 30 days? (Cigarettes, Smokeless Tobacco, Cigars, and/or Pipes): Patient Refused Screening  Has patient been referred to the Quitline?: Patient refused referral  Patient has been referred for addiction treatment: Pt. refused referral  Harden Mo, LCSW 07/27/2019, 9:49 AM

## 2019-07-27 NOTE — Plan of Care (Signed)
Patient stated she was feeling better with her anxiety and is ready to leave tomorrow  Problem: Coping: Goal: Level of anxiety will decrease Outcome: Progressing

## 2019-07-27 NOTE — Discharge Summary (Signed)
Physician Discharge Summary Note  Patient:  Maria Williamson is an 69 y.o., female MRN:  [id number] DOB:  1950-09-26 Patient phone:  [phone] (home)  Patient address:   [address] [place] [address],  Total Time spent with patient: 30 minutes  Date of Admission:  07/24/2019 Date of Discharge: 07/27/2019  Reason for Admission: Admitted to the hospital with suicidal ideation and depression  Principal Problem: Severe recurrent major depression without psychotic features [facility]) Discharge Diagnoses: Principal Problem:   Severe recurrent major depression without psychotic features ([place]) Active Problems:   GERD (gastroesophageal reflux disease)   Diabetes ([place])   Past Psychiatric History: Past history of longstanding depression and mood instability  Past Medical History:  Past Medical History:  Diagnosis Date  . Depression   . Diabetes mellitus without complication ([place])   . GERD (gastroesophageal reflux disease)   . Hypercholesterolemia   . Osteoarthritis   . Sleep apnea    cannot tolerate CPAP    Past Surgical History:  Procedure Laterality Date  . BIOPSY  03/10/2017   Procedure: BIOPSY;  Surgeon: Daneil Dolin, MD;  [facility];  Service: Endoscopy;;  gastric  . BREAST BIOPSY     90's  . ESOPHAGOGASTRODUODENOSCOPY (EGD) WITH PROPOFOL N/A 03/10/2017   Procedure: ESOPHAGOGASTRODUODENOSCOPY (EGD) WITH PROPOFOL;  Surgeon: Daneil Dolin, MD;  Location[facility];  Service: Endoscopy;  Laterality: N/A;  8:30AM  . HYSTERECTOMY ABDOMINAL WITH SALPINGECTOMY    . KNEE SURGERY Left   . MALONEY DILATION N/A 03/10/2017   Procedure: Venia Minks DILATION;  Surgeon: Daneil Dolin, MD;  [facility];  Service: Endoscopy;  Laterality: N/A;  . NASAL SEPTUM SURGERY    . TUBAL LIGATION     Family History:  Family History  Problem Relation Age of Onset  . Heart failure Mother   . Heart disease Father   . Cancer Father        Lung  . Multiple sclerosis  Daughter   . Diabetes Paternal Aunt   . Heart disease Maternal Grandmother   . Heart disease Maternal Grandfather   . Diabetes Paternal Grandmother   . Heart disease Paternal Grandmother   . Heart disease Paternal Grandfather   . Colon cancer Neg Hx   . Colon polyps Neg Hx    Family Psychiatric  History: See previous Social History:  Social History   Substance and Sexual Activity  Alcohol Use No     Social History   Substance and Sexual Activity  Drug Use No    Social History   Socioeconomic History  . Marital status: Divorced    Spouse name: Not on file  . Number of children: Not on file  . Years of education: Not on file  . Highest education level: Not on file  Occupational History  . 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 Smoking status: Former Smoker    Packs/day: 1.00    Years: 26.00    Pack years: 26.00    Types: Cigarettes    Quit date: 12/09/2004    Years since quitting: 14.6  . Smokeless tobacco: Never Used  Vaping Use  . Vaping Use: Unknown  Substance and Sexual Activity  . Alcohol use: No  . Drug use: No  . Sexual activity: Never    Birth control/protection: None  Other Topics Concern  . Not on file  Social History Narrative  . Not  on file   Social Determinants of Health   Financial Resource Strain:   . Difficulty of Paying Living Expenses:   Food Insecurity:   . Worried About Programme researcher, broadcasting/film/video in the Last Year:   . Barista in the Last Year:   Transportation Needs:   . Freight forwarder (Medical):   Marland Kitchen Lack of Transportation (Non-Medical):   Physical Activity:   . Days of Exercise per Week:   . Minutes of Exercise per Session:   Stress:   . Feeling of Stress :   Social Connections:   . Frequency of Communication with Friends and Family:   . Frequency of Social Gatherings with Friends and Family:   . Attends Religious Services:   . Active Member of Clubs or Organizations:   .  Attends Banker Meetings:   Marland Kitchen Marital Status:     Hospital Course: Maintained on 15-minute checks.  No dangerous behavior in the hospital.  No suicidal behavior no aggression.  Cooperated appropriately with treatment.  Attended treatment team and discussed appropriate goals.  Some minor adjustment to medicine for major depression.  Discussed with her the importance of staying in outpatient treatment especially therapy to which she agreed.  At the time of discharge she said her mood was feeling much better and denied all suicidal thought and appeared lucid and cooperative with outpatient planning  Physical Findings: AIMS: Facial and Oral Movements Muscles of Facial Expression: None, normal Lips and Perioral Area: None, normal Jaw: None, normal Tongue: None, normal,Extremity Movements Upper (arms, wrists, hands, fingers): None, normal Lower (legs, knees, ankles, toes): None, normal, Trunk Movements Neck, shoulders, hips: None, normal, Overall Severity Severity of abnormal movements (highest score from questions above): None, normal Incapacitation due to abnormal movements: None, normal Patient's awareness of abnormal movements (rate only patient's report): No Awareness, Dental Status Current problems with teeth and/or dentures?: No Does patient usually wear dentures?: No  CIWA:  CIWA-Ar Total: 1 COWS:  COWS Total Score: 1  Musculoskeletal: Strength & Muscle Tone: within normal limits Gait & Station: normal Patient leans: N/A  Psychiatric Specialty Exam: Physical Exam  Nursing note and vitals reviewed. Constitutional: She appears well-developed.  HENT:  Head: Normocephalic and atraumatic.  Eyes: Pupils are equal, round, and reactive to light. Conjunctivae are normal.  Cardiovascular: Normal heart sounds.  Respiratory: Effort normal.  GI: Soft.  Musculoskeletal:        General: Normal range of motion.     Cervical back: Normal range of motion.  Neurological: She is  alert.  Skin: Skin is warm and dry.  Psychiatric: Mood normal.    Review of Systems  Constitutional: Negative.   HENT: Negative.   Eyes: Negative.   Respiratory: Negative.   Cardiovascular: Negative.   Gastrointestinal: Negative.   Musculoskeletal: Negative.   Skin: Negative.   Neurological: Negative.   Psychiatric/Behavioral: Negative.     Blood pressure 124/64, pulse 81, temperature 97.8 F (36.6 C), temperature source Oral, resp. rate 17, height 5' 4.5" (1.638 m), weight 98.4 kg, SpO2 94 %.Body mass index is 36.67 kg/m.  General Appearance: Casual  Eye Contact:  Absent  Speech:  Clear and Coherent  Volume:  Normal  Mood:  Euphoric  Affect:  Constricted  Thought Process:  Goal Directed  Orientation:  Full (Time, Place, and Person)  Thought Content:  Logical  Suicidal Thoughts:  No  Homicidal Thoughts:  No  Memory:  Immediate;   Fair Recent;   Fair Remote;  Fair  Judgement:  Fair  Insight:  Fair  Psychomotor Activity:  Normal  Concentration:  Concentration: Fair  Recall:  [employer] of Knowledge:  Fair  Language:  Fair  Akathisia:  No  Handed:  Right  AIMS (if indicated):     Assets:  Desire for Improvement Housing Physical Health  ADL's:  Intact  Cognition:  WNL  Sleep:  Number of Hours: 6     Have you used any form of tobacco in the last 30 days? (Cigarettes, Smokeless Tobacco, Cigars, and/or Pipes): Patient Refused Screening  Has this patient used any form of tobacco in the last 30 days? (Cigarettes, Smokeless Tobacco, Cigars, and/or Pipes) Yes, No  Blood Alcohol level:  Lab Results  Component Value Date   ETH <10 07/21/2019    Metabolic Disorder Labs:  No results found for: HGBA1C, MPG No results found for: PROLACTIN No results found for: CHOL, TRIG, HDL, CHOLHDL, VLDL, LDLCALC  See Psychiatric Specialty Exam and Suicide Risk Assessment completed by Attending Physician prior to discharge.  Discharge destination:  Home  Is patient on multiple  antipsychotic therapies at discharge:  No   Has Patient had three or more failed trials of antipsychotic monotherapy by history:  No  Recommended Plan for Multiple Antipsychotic Therapies: NA  Discharge Instructions    Diet - low sodium heart healthy   Complete by: As directed    Increase activity slowly   Complete by: As directed      Allergies as of 07/27/2019      Reactions   Excedrin Extra Strength [asa-apap-caff Buffered] Diarrhea, Nausea Only   Statins Other (See Comments)   Hair Loss   Wellbutrin [bupropion] Hives      Medication List    STOP taking these medications   citalopram 40 MG tablet Commonly known as: CELEXA   diclofenac sodium 1 % Gel Commonly known as: Voltaren   fluconazole 100 MG tablet Commonly known as: DIFLUCAN   hydroxypropyl methylcellulose / hypromellose 2.5 % ophthalmic solution Commonly known as: ISOPTO TEARS / GONIOVISC   ibuprofen 200 MG tablet Commonly known as: ADVIL   levocetirizine 5 MG tablet Commonly known as: XYZAL   lovastatin 10 MG tablet Commonly known as: MEVACOR Replaced by: pravastatin 10 MG tablet   methylphenidate 10 MG tablet Commonly known as: RITALIN   multivitamin with minerals Tabs tablet   pregabalin 75 MG capsule Commonly known as: LYRICA     TAKE these medications     Indication  aspirin 81 MG EC tablet Take 1 tablet (81 mg total) by mouth at bedtime. Swallow whole. What changed:   when to take this  additional instructions  Indication: Stable Angina Pectoris   docusate sodium 100 MG capsule Commonly known as: COLACE Take 1 capsule (100 mg total) by mouth daily.  Indication: Constipation   DULoxetine 60 MG capsule Commonly known as: CYMBALTA Take 1 capsule (60 mg total) by mouth at bedtime.  Indication: Major Depressive Disorder   famotidine 20 MG tablet Commonly known as: PEPCID Take 1 tablet (20 mg total) by mouth 2 (two) times daily.  Indication: Gastroesophageal Reflux Disease    furosemide 20 MG tablet Commonly known as: LASIX Take 0.5 tablets (10 mg total) by mouth daily.  Indication: Edema   loratadine 10 MG tablet Commonly known as: CLARITIN Take 1 tablet (10 mg total) by mouth daily. Start taking on: July 28, 2019  Indication: Perennial Allergic Rhinitis   LORazepam 1 MG tablet Commonly known as: ATIVAN  Take 2 tablets (2 mg total) by mouth at bedtime.  Indication: Trouble Sleeping   metformin 1000 MG (OSM) 24 hr tablet Commonly known as: FORTAMET Take 2 tablets (2,000 mg total) by mouth daily with breakfast. Start taking on: July 28, 2019 What changed:   medication strength  when to take this  Indication: Type 2 Diabetes   omeprazole 40 MG capsule Commonly known as: PRILOSEC Take 40 mg by mouth daily.  Indication: Gastroesophageal Reflux Disease   pravastatin 10 MG tablet Commonly known as: PRAVACHOL Take 1 tablet (10 mg total) by mouth daily at 6 PM. Replaces: lovastatin 10 MG tablet  Indication: High Amount of Fats in the Blood   Tresiba 100 UNIT/ML Soln Generic drug: Insulin Degludec Inject 35 Units into the skin 2 (two) times daily.  Indication: Type 2 Diabetes   Victoza 18 MG/3ML Sopn Generic drug: liraglutide Inject 1.2 mg into the skin daily.  Indication: Type 2 Diabetes       Follow-up Information    [facility] Follow up.   Why: Appointment is scheduled for 07/31/2019 at 2:30pm. This is a consultation for therapy and psychiatry.  Log into your MyChart, an email is being sent with information.  To adjust appt please call [phone]. Thanks! Contact information: [address] [place],  [place]  [address] Main: [phone]              Follow-up recommendations:  Activity:  Activity as tolerated Diet:  Regular diet Other:  Follow-up with outpatient treatment with [facility] outpatient  Comments: Prescriptions provided at discharge  Signed: Mordecai Rasmussen,  MD 07/27/2019, 4:59 PM

## 2019-07-27 NOTE — Progress Notes (Signed)
D: Pt A & O X 4. Denies SI, HI, AVH and pain at this time. Reports feeling anxious related to daughter picking her up "I just have to wait for my daughter, she will not come till y'all call her". D/C home as ordered. Picked up in medical mall by her daughter. A: D/C instructions reviewed with pt including prescriptions and follow up appointment; compliance encouraged. All belongings from locker 3 returned to pt at time of departure. Scheduled medications administered with verbal education and effects monitored. Safety checks maintained without incident till time of d/c.  R: Pt receptive to care. Compliant with medications when offered. Denies adverse drug reactions when assessed. Verbalized understanding related to d/c instructions. Signed belonging sheet in agreement with items received from locker. Ambulatory off unit with a steady gait, accompanied by staff. Appears to be in no physical distress at time of departure.

## 2019-07-31 ENCOUNTER — Ambulatory Visit (HOSPITAL_COMMUNITY): Payer: Medicare Other | Admitting: Professional

## 2019-08-01 ENCOUNTER — Ambulatory Visit (INDEPENDENT_AMBULATORY_CARE_PROVIDER_SITE_OTHER): Payer: Medicare Other | Admitting: Licensed Clinical Social Worker

## 2019-08-01 DIAGNOSIS — F332 Major depressive disorder, recurrent severe without psychotic features: Secondary | ICD-10-CM
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
## 2019-09-05 DIAGNOSIS — G4700 Insomnia, unspecified: Secondary | ICD-10-CM | POA: Diagnosis not present

## 2019-09-05 DIAGNOSIS — F332 Major depressive disorder, recurrent severe without psychotic features: Secondary | ICD-10-CM | POA: Diagnosis not present

## 2019-09-05 DIAGNOSIS — F411 Generalized anxiety disorder: Secondary | ICD-10-CM | POA: Diagnosis not present

## 2019-09-07 DIAGNOSIS — E782 Mixed hyperlipidemia: Secondary | ICD-10-CM | POA: Diagnosis not present

## 2019-09-07 DIAGNOSIS — K219 Gastro-esophageal reflux disease without esophagitis: Secondary | ICD-10-CM | POA: Diagnosis not present

## 2019-09-07 DIAGNOSIS — F330 Major depressive disorder, recurrent, mild: Secondary | ICD-10-CM | POA: Diagnosis not present

## 2019-09-07 DIAGNOSIS — E1165 Type 2 diabetes mellitus with hyperglycemia: Secondary | ICD-10-CM | POA: Diagnosis not present

## 2019-09-17 ENCOUNTER — Other Ambulatory Visit (HOSPITAL_COMMUNITY): Payer: Self-pay | Admitting: Internal Medicine

## 2019-09-17 DIAGNOSIS — Z1231 Encounter for screening mammogram for malignant neoplasm of breast: Secondary | ICD-10-CM

## 2019-09-21 ENCOUNTER — Other Ambulatory Visit: Payer: Self-pay

## 2019-09-21 ENCOUNTER — Ambulatory Visit (HOSPITAL_COMMUNITY)
Admission: RE | Admit: 2019-09-21 | Discharge: 2019-09-21 | Disposition: A | Discharge status: Home / Self Care | Payer: Medicare Other | Source: Ambulatory Visit | Attending: Internal Medicine | Admitting: Internal Medicine

## 2019-09-21 DIAGNOSIS — Z1231 Encounter for screening mammogram for malignant neoplasm of breast: Secondary | ICD-10-CM

## 2019-10-05 ENCOUNTER — Encounter (HOSPITAL_COMMUNITY): Payer: Self-pay | Admitting: Psychiatry

## 2019-10-05 ENCOUNTER — Ambulatory Visit (INDEPENDENT_AMBULATORY_CARE_PROVIDER_SITE_OTHER): Payer: Medicare Other | Admitting: Psychiatry

## 2019-10-05 ENCOUNTER — Other Ambulatory Visit: Payer: Self-pay

## 2019-10-05 DIAGNOSIS — F332 Major depressive disorder, recurrent severe without psychotic features: Secondary | ICD-10-CM | POA: Diagnosis not present
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
## 2019-10-08 MED FILL — Divalproex Sodium Cap Delayed Release Sprinkle 125 MG: ORAL | Qty: 250 | Status: AC

## 2019-10-08 MED FILL — Pravastatin Sodium Tab 20 MG: ORAL | Qty: 10 | Status: AC

## 2019-10-08 MED FILL — Aspirin Tab Delayed Release 81 MG: ORAL | Qty: 81 | Status: AC

## 2019-10-08 MED FILL — Docusate Sodium Cap 100 MG: ORAL | Qty: 100 | Status: AC

## 2019-10-08 MED FILL — Lorazepam Tab 1 MG: ORAL | Qty: 1 | Status: AC

## 2019-10-08 MED FILL — Metformin HCl Tab ER 24HR 500 MG: ORAL | Qty: 500 | Status: AC

## 2019-10-08 MED FILL — Metformin HCl Tab ER 24HR 750 MG: ORAL | Qty: 1500 | Status: AC

## 2019-10-08 MED FILL — Furosemide Tab 20 MG: ORAL | Qty: 10 | Status: AC

## 2019-10-08 MED FILL — Acetaminophen Tab 325 MG: ORAL | Qty: 650 | Status: AC

## 2019-10-22 ENCOUNTER — Other Ambulatory Visit: Payer: Self-pay

## 2019-10-22 ENCOUNTER — Ambulatory Visit (INDEPENDENT_AMBULATORY_CARE_PROVIDER_SITE_OTHER): Payer: Medicare Other | Admitting: Psychiatry

## 2019-10-22 DIAGNOSIS — F332 Major depressive disorder, recurrent severe without psychotic features: Secondary | ICD-10-CM | POA: Diagnosis not present

## 2019-10-22 NOTE — Progress Notes (Signed)
Virtual Visit via Video Note  I connected with [name] on 10/22/19 at  4:00 PM EDT by a video enabled telemedicine application and verified that I am speaking with the correct person using two identifiers.   I discussed the limitations of evaluation and management by telemedicine and the availability of in person appointments. The patient expressed understanding and agreed to proceed.  I provided 50 minutes of non-face-to-face time during this encounter.   Adah Salvage, LCSW    THERAPIST PROGRESS NOTE  Location: Patient - Home/ Provider - [facility] Out[facility] [place] office   Session Time: Monday 10/22/2019 4:00 PM - 4:50 PM  Participation Level: Active  Behavioral Response: CasualAlertAngry, Depressed and Irritable  Type of Therapy: Individual Therapy  Treatment Goals addressed: Establish rapport, patient wants to improve coping skills and deal with her grief,  learn and implement cognitive and behavioral strategies to overcome depression  Interventions: CBT and Supportive  Summary: Maria Williamson is a 69 y.o. female who is referred for services due to experiencing symptoms of depression. She reports two psychiatric hospitalizations. The last one occurred in June 2021 due to suicidal ideations. She receives medication management from PCP. She started on celexa about 6 years ago and started taking ativan about 20 years ago. She reports previous participation in outpatient psychotherapy and last was seen in [place] in the 1990's. Patient reports long standing history of recurrent periods of depression. Her current episode apparently was triggered by conflict with her oldest daughter and patient no longer being allowed to see her 51-year-old great grandson because she teased him per patient's report.  Patient's current symptoms include anger, depressed mood, crying spells, isolated behaviors, irritability, fatigue, tearfulness, and thoughts/feelings of worthlessness.  Patient  last was seen via virtual visit for the assessment appointment about 3 weeks ago.  She reports continued depressed mood, irritability, anger, tearfulness, and isolated behaviors.  She continues to experience grief and loss issues regarding the relationship with her family.  She expresses anger and frustration regarding relationship with her daughter as they once were very close.  She continues to reside with daughter but they have minimal contact.  She reports additional grief and loss issues related to the death of her favorite nephew due to a motorcycle accident on September 4.  She also reports worry about her mother who has dementia and recently was hospitalized due to an UTI.    Suicidal/Homicidal: Nowithout intent/plan  Therapist Response: Established rapport, reviewed symptoms, discussed stressors, facilitated expression of thoughts and feelings, validated feelings and normalized feelings related to grief and loss issues, assisted patient began to identify underlying feelings beneath anger regarding relationship with daughter, assisted patient began to examine her pattern of interaction with daughter, began to orient patient to CBT, began to discuss targets for treatment including improving assertive communication, develop plan with patient to begin journaling to express her thoughts and feelings  Plan: Return again in weeks.  Diagnosis: Axis I: MDD, Recurrent    Axis II: Deferred    Adah Salvage, LCSW 10/22/2019

## 2019-10-23 ENCOUNTER — Ambulatory Visit (HOSPITAL_COMMUNITY): Payer: Medicare Other | Admitting: Psychiatry

## 2019-10-23 DIAGNOSIS — M25511 Pain in right shoulder: Secondary | ICD-10-CM | POA: Diagnosis not present

## 2019-10-23 DIAGNOSIS — E1165 Type 2 diabetes mellitus with hyperglycemia: Secondary | ICD-10-CM | POA: Diagnosis not present

## 2019-10-23 DIAGNOSIS — R945 Abnormal results of liver function studies: Secondary | ICD-10-CM | POA: Diagnosis not present

## 2019-10-23 DIAGNOSIS — F330 Major depressive disorder, recurrent, mild: Secondary | ICD-10-CM | POA: Diagnosis not present

## 2019-10-23 DIAGNOSIS — M5430 Sciatica, unspecified side: Secondary | ICD-10-CM | POA: Diagnosis not present

## 2019-10-23 DIAGNOSIS — Z0189 Encounter for other specified special examinations: Secondary | ICD-10-CM | POA: Diagnosis not present

## 2019-10-23 DIAGNOSIS — F5081 Binge eating disorder: Secondary | ICD-10-CM | POA: Diagnosis not present

## 2019-10-23 DIAGNOSIS — E785 Hyperlipidemia, unspecified: Secondary | ICD-10-CM | POA: Diagnosis not present

## 2019-10-23 DIAGNOSIS — E7849 Other hyperlipidemia: Secondary | ICD-10-CM | POA: Diagnosis not present

## 2019-10-23 DIAGNOSIS — F332 Major depressive disorder, recurrent severe without psychotic features: Secondary | ICD-10-CM | POA: Diagnosis not present

## 2019-10-23 DIAGNOSIS — Z6834 Body mass index (BMI) 34.0-34.9, adult: Secondary | ICD-10-CM | POA: Diagnosis not present

## 2019-10-23 DIAGNOSIS — E782 Mixed hyperlipidemia: Secondary | ICD-10-CM | POA: Diagnosis not present

## 2019-11-06 ENCOUNTER — Ambulatory Visit (INDEPENDENT_AMBULATORY_CARE_PROVIDER_SITE_OTHER): Payer: Medicare Other | Admitting: Psychiatry

## 2019-11-06 ENCOUNTER — Other Ambulatory Visit: Payer: Self-pay

## 2019-11-06 DIAGNOSIS — K219 Gastro-esophageal reflux disease without esophagitis: Secondary | ICD-10-CM | POA: Diagnosis not present

## 2019-11-06 DIAGNOSIS — F332 Major depressive disorder, recurrent severe without psychotic features: Secondary | ICD-10-CM

## 2019-11-06 DIAGNOSIS — Z6835 Body mass index (BMI) 35.0-35.9, adult: Secondary | ICD-10-CM | POA: Diagnosis not present

## 2019-11-06 DIAGNOSIS — Z6834 Body mass index (BMI) 34.0-34.9, adult: Secondary | ICD-10-CM | POA: Diagnosis not present

## 2019-11-06 DIAGNOSIS — J302 Other seasonal allergic rhinitis: Secondary | ICD-10-CM | POA: Diagnosis not present

## 2019-11-06 DIAGNOSIS — M545 Low back pain: Secondary | ICD-10-CM | POA: Diagnosis not present

## 2019-11-06 DIAGNOSIS — E1165 Type 2 diabetes mellitus with hyperglycemia: Secondary | ICD-10-CM | POA: Diagnosis not present

## 2019-11-06 DIAGNOSIS — E1169 Type 2 diabetes mellitus with other specified complication: Secondary | ICD-10-CM | POA: Diagnosis not present

## 2019-11-06 DIAGNOSIS — F411 Generalized anxiety disorder: Secondary | ICD-10-CM | POA: Diagnosis not present

## 2019-11-06 DIAGNOSIS — R945 Abnormal results of liver function studies: Secondary | ICD-10-CM | POA: Diagnosis not present

## 2019-11-06 DIAGNOSIS — E7849 Other hyperlipidemia: Secondary | ICD-10-CM | POA: Diagnosis not present

## 2019-11-06 DIAGNOSIS — L900 Lichen sclerosus et atrophicus: Secondary | ICD-10-CM | POA: Diagnosis not present

## 2019-11-06 DIAGNOSIS — Z6833 Body mass index (BMI) 33.0-33.9, adult: Secondary | ICD-10-CM | POA: Diagnosis not present

## 2019-11-06 DIAGNOSIS — F5081 Binge eating disorder: Secondary | ICD-10-CM | POA: Diagnosis not present

## 2019-11-06 DIAGNOSIS — E669 Obesity, unspecified: Secondary | ICD-10-CM | POA: Diagnosis not present

## 2019-11-06 DIAGNOSIS — Z0189 Encounter for other specified special examinations: Secondary | ICD-10-CM | POA: Diagnosis not present

## 2019-11-06 DIAGNOSIS — G4700 Insomnia, unspecified: Secondary | ICD-10-CM | POA: Diagnosis not present

## 2019-11-06 DIAGNOSIS — E782 Mixed hyperlipidemia: Secondary | ICD-10-CM | POA: Diagnosis not present

## 2019-11-06 DIAGNOSIS — F330 Major depressive disorder, recurrent, mild: Secondary | ICD-10-CM | POA: Diagnosis not present

## 2019-11-06 NOTE — Progress Notes (Signed)
Virtual Visit via Video Note  I connected with Maria Williamson on 11/06/19 at 11:07 PM  by a video enabled telemedicine application and verified that I am speaking with the correct person using two identifiers.   I discussed the limitations of evaluation and management by telemedicine and the availability of in person appointments. The patient expressed understanding and agreed to proceed.  I provided 27  minutes of non-face-to-face time during this encounter.   Adah Salvage, LCSW   THERAPIST PROGRESS NOTE  Location: Patient - Home/ Provider - [facility] [place] office   Session Time: Tuesday  11/06/2019 11:07 AM - 11:34 AM   Participation Level: Active  Behavioral Response: CasualAlertAngry, Depressed and Irritable  Type of Therapy: Individual Therapy  Treatment Goals addressed: Establish rapport, patient wants to improve coping skills and deal with her grief,  learn and implement cognitive and behavioral strategies to overcome depression  Interventions: CBT and Supportive  Summary: Maria Williamson is a 69 y.o. female who is referred for services due to experiencing symptoms of depression. She reports two psychiatric hospitalizations. The last one occurred in June 2021 due to suicidal ideations. She receives medication management from PCP. She started on celexa about 6 years ago and started taking ativan about 20 years ago. She reports previous participation in outpatient psychotherapy and last was seen in [place] in the 1990's. Patient reports long standing history of recurrent periods of depression. Her current episode apparently was triggered by conflict with her oldest daughter and patient no longer being allowed to see her 11-year-old great grandson because she teased him per patient's report.  Patient's current symptoms include anger, depressed mood, crying spells, isolated behaviors, irritability, fatigue, tearfulness, and thoughts/feelings of worthlessness.  Patient  last was seen via virtual visit  About 2 - 3 weeks ago.  She reports her mother died on 2019/10/28.  She expresses appropriate sadness about this but reports coping well.  She is pleased she was able to see her mother before she died.  She reports overall mood has improved significantly  and is pleased with her efforts to make changes within self.  She reports using more empathic communication in the relationship with her daughter and says there has been much improvement in the relationship.  She states learning to let things go and reports this has resulted in decreased worry.  She reports she is planning to move back to [place] the beginning of next month as an apartment became available where patient wants to live.  She is excited about moving back to her hometown and living near her other daughter as well as friends.  Patient is pleased with her progress and expresses confidence in her use of coping skills.  She is considering finding a therapist in [place].    Suicidal/Homicidal: Nowithout intent/plan  Therapist Response:  reviewed symptoms, discussed stressors, facilitated expression of thoughts and feelings, validated feelings and normalized feelings related to grief and loss issues, praised and reinforced patient's introspection and her use of empathic communication, discussed effects, discussed lapse versus relapse of depression, assisted patient identify early warning signs of depression and ways to intervene, also assisted patient identify ways to maintain self-care, did termination as patient is moving out of state   Plan:  Diagnosis: Axis I: MDD, Recurrent    Axis II: Deferred    Adah Salvage, LCSW 11/06/2019   Outpatient Therapist Discharge Summary  Maria Williamson    1951-01-19   Admission Date: 08/29/2019 Discharge Date:  11/06/2019 Reason for Discharge:  Patient is moving to  Diagnosis:  Axis I:  Severe recurrent major depression without psychotic  features (HCC)   Comments:   Jago Carton E Syvanna Ciolino LCSW

## 2019-11-20 ENCOUNTER — Ambulatory Visit (HOSPITAL_COMMUNITY): Payer: Medicare Other | Admitting: Psychiatry

## 2019-12-25 DIAGNOSIS — Z23 Encounter for immunization: Secondary | ICD-10-CM | POA: Diagnosis not present

## 2020-01-07 DIAGNOSIS — E785 Hyperlipidemia, unspecified: Secondary | ICD-10-CM | POA: Diagnosis not present

## 2020-01-07 DIAGNOSIS — Z6834 Body mass index (BMI) 34.0-34.9, adult: Secondary | ICD-10-CM | POA: Diagnosis not present

## 2020-01-07 DIAGNOSIS — I10 Essential (primary) hypertension: Secondary | ICD-10-CM | POA: Diagnosis not present

## 2020-01-07 DIAGNOSIS — E119 Type 2 diabetes mellitus without complications: Secondary | ICD-10-CM | POA: Diagnosis not present

## 2020-01-07 DIAGNOSIS — Z780 Asymptomatic menopausal state: Secondary | ICD-10-CM | POA: Diagnosis not present

## 2020-01-07 DIAGNOSIS — Z794 Long term (current) use of insulin: Secondary | ICD-10-CM | POA: Diagnosis not present

## 2020-01-09 DIAGNOSIS — Z6834 Body mass index (BMI) 34.0-34.9, adult: Secondary | ICD-10-CM | POA: Diagnosis not present

## 2020-01-09 DIAGNOSIS — E119 Type 2 diabetes mellitus without complications: Secondary | ICD-10-CM | POA: Diagnosis not present

## 2020-01-09 DIAGNOSIS — Z794 Long term (current) use of insulin: Secondary | ICD-10-CM | POA: Diagnosis not present

## 2020-01-11 DIAGNOSIS — H2513 Age-related nuclear cataract, bilateral: Secondary | ICD-10-CM | POA: Diagnosis not present

## 2020-01-11 DIAGNOSIS — H18593 Other hereditary corneal dystrophies, bilateral: Secondary | ICD-10-CM | POA: Diagnosis not present

## 2020-01-11 DIAGNOSIS — H02132 Senile ectropion of right lower eyelid: Secondary | ICD-10-CM | POA: Diagnosis not present

## 2020-01-11 DIAGNOSIS — E119 Type 2 diabetes mellitus without complications: Secondary | ICD-10-CM | POA: Diagnosis not present

## 2020-01-16 DIAGNOSIS — E1149 Type 2 diabetes mellitus with other diabetic neurological complication: Secondary | ICD-10-CM | POA: Diagnosis not present

## 2020-01-16 DIAGNOSIS — B351 Tinea unguium: Secondary | ICD-10-CM | POA: Diagnosis not present

## 2020-01-16 DIAGNOSIS — E1140 Type 2 diabetes mellitus with diabetic neuropathy, unspecified: Secondary | ICD-10-CM | POA: Diagnosis not present

## 2020-01-16 DIAGNOSIS — Z7984 Long term (current) use of oral hypoglycemic drugs: Secondary | ICD-10-CM | POA: Diagnosis not present

## 2020-01-24 DIAGNOSIS — Z780 Asymptomatic menopausal state: Secondary | ICD-10-CM | POA: Diagnosis not present

## 2020-01-31 DIAGNOSIS — Z794 Long term (current) use of insulin: Secondary | ICD-10-CM | POA: Diagnosis not present

## 2020-01-31 DIAGNOSIS — R197 Diarrhea, unspecified: Secondary | ICD-10-CM | POA: Diagnosis not present

## 2020-01-31 DIAGNOSIS — E119 Type 2 diabetes mellitus without complications: Secondary | ICD-10-CM | POA: Diagnosis not present

## 2021-01-09 IMAGING — MG DIGITAL SCREENING BILATERAL MAMMOGRAM WITH TOMO AND CAD
8 series · 8 of 24 positions shown · non-contrast
Comparison: None.

ACR Breast Density Category a: The breast tissue is almost entirely
fatty.

CLINICAL DATA: Screening.

EXAM:
DIGITAL SCREENING BILATERAL MAMMOGRAM WITH TOMO AND CAD

[L CC synth-2D]
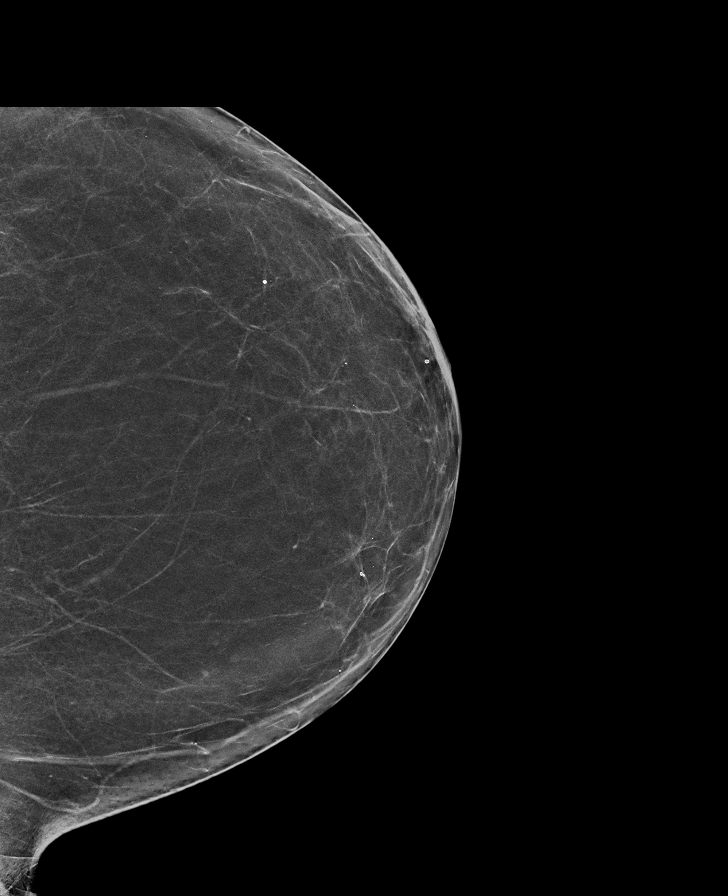

[L MLO synth-2D]
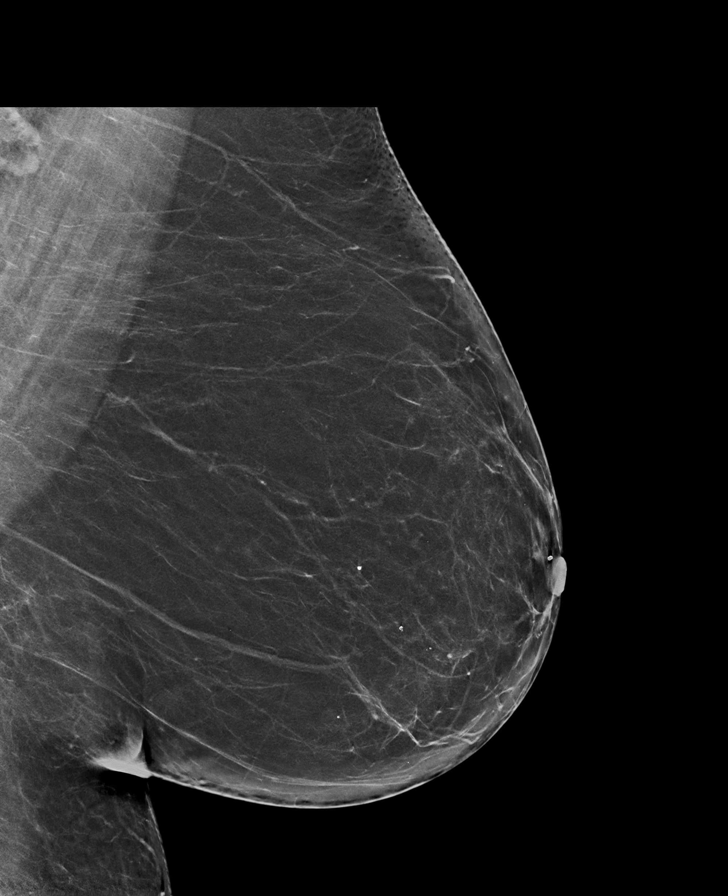

[R CC synth-2D]
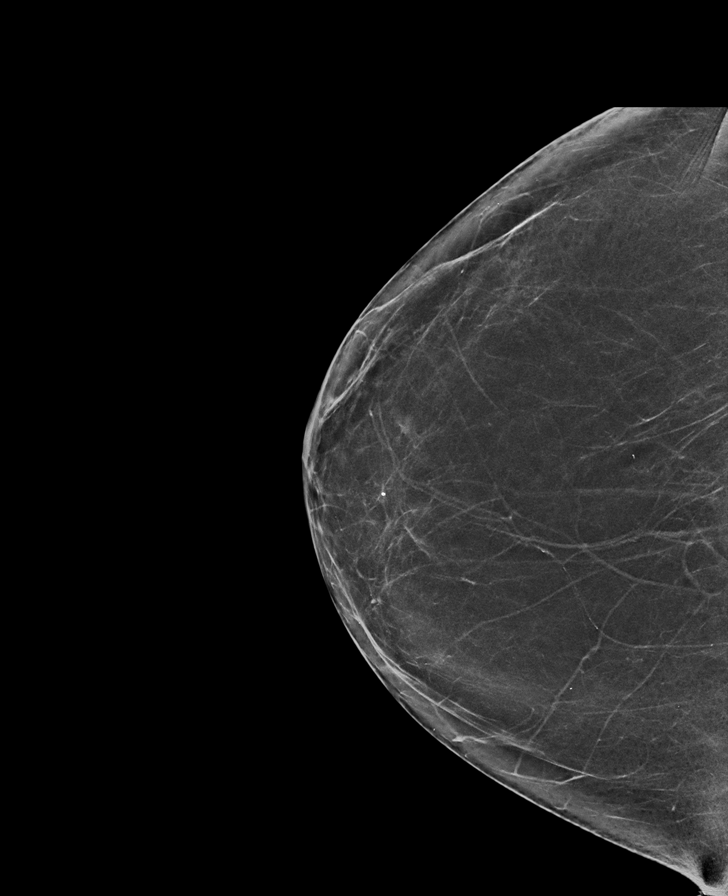

[R MLO synth-2D]
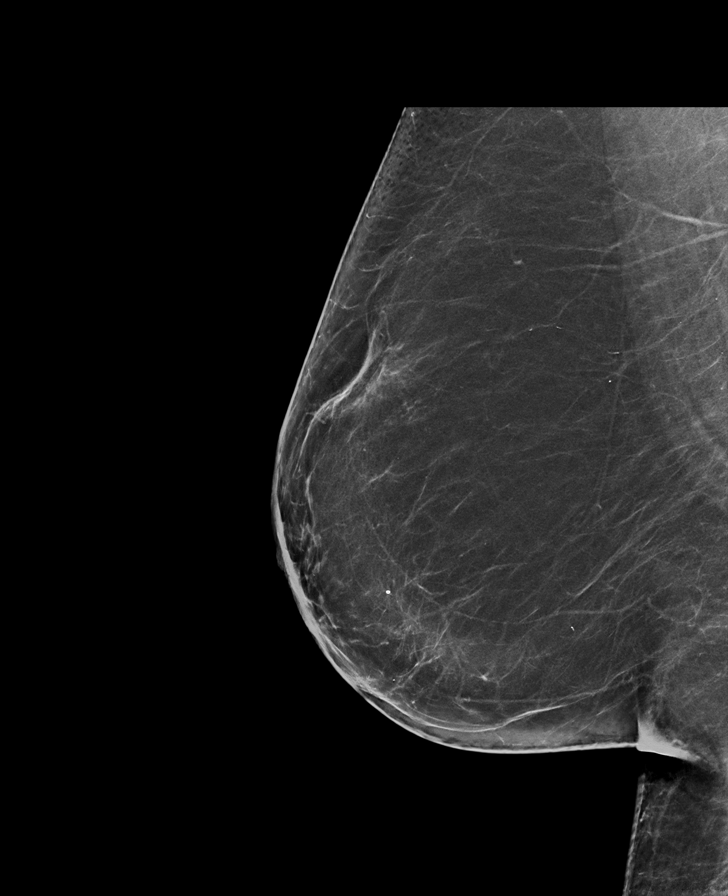

[L CC tomo · tomo slice 35/68.0]
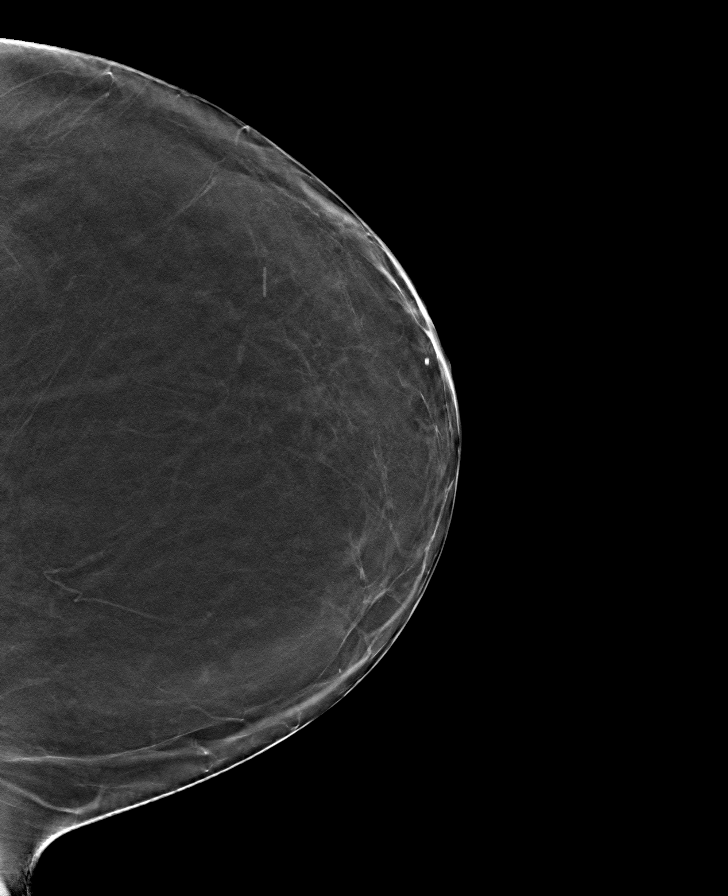

[L MLO tomo · tomo slice 39/78.0]
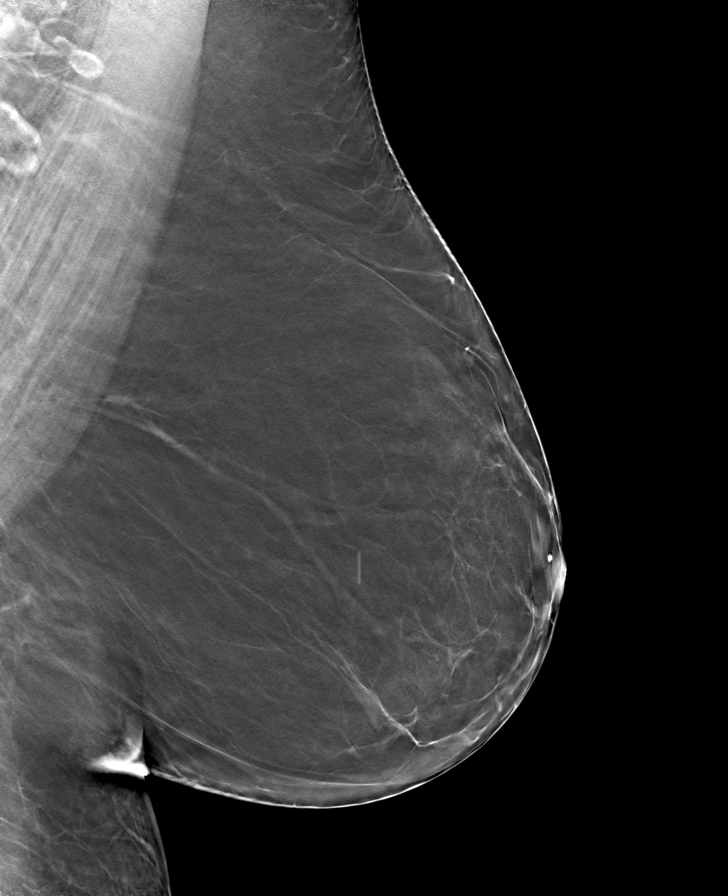

[R CC tomo · tomo slice 35/69.0]
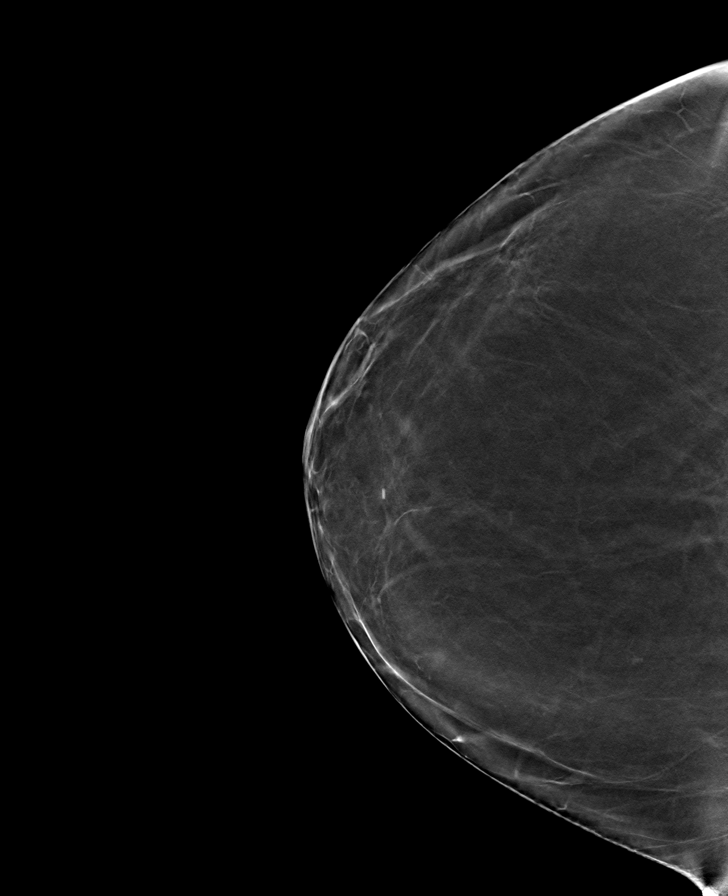

[R MLO tomo · tomo slice 39/76.0]
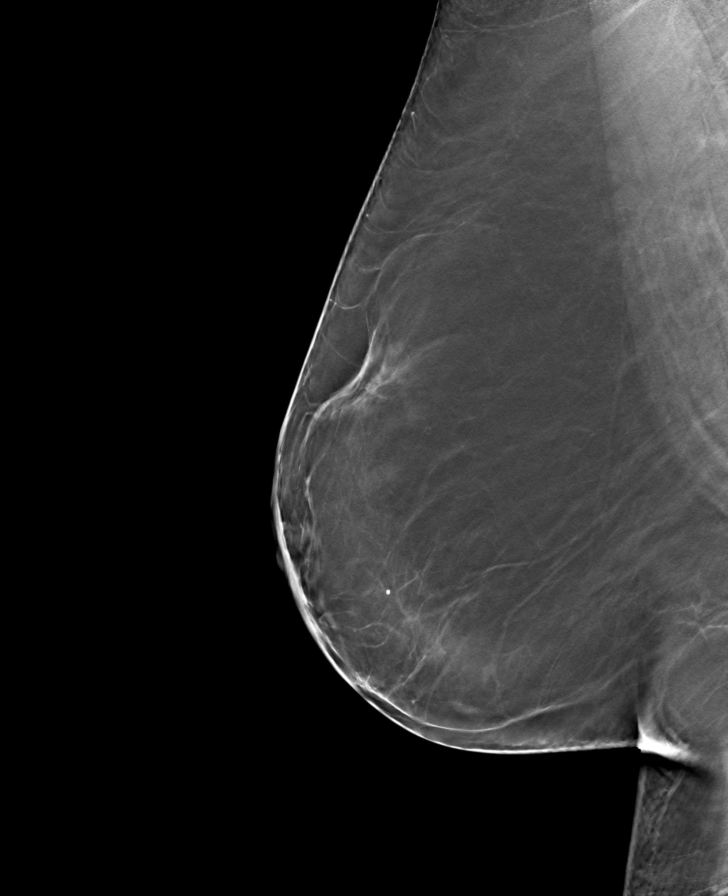

[8 of 24 positions shown; findings below may reference images not displayed]

FINDINGS: There are no findings suspicious for malignancy. Images were
processed with CAD.
IMPRESSION: No mammographic evidence of malignancy. A result letter of this
screening mammogram will be mailed directly to the patient.

RECOMMENDATION:
Screening mammogram in one year. (Code:[id number])

BI-RADS CATEGORY  1: Negative.
